# Patient Record
Sex: Female | Born: 1945 | Race: Black or African American | Hispanic: No | Marital: Single | Smoking: Never smoker
Health system: Southern US, Community
[De-identification: ages and names within clinical notes are randomized; demographics above are authoritative.]

## PROBLEM LIST (undated history)

## (undated) DIAGNOSIS — I10 Essential (primary) hypertension: Secondary | ICD-10-CM

## (undated) DIAGNOSIS — R011 Cardiac murmur, unspecified: Secondary | ICD-10-CM

## (undated) DIAGNOSIS — D649 Anemia, unspecified: Secondary | ICD-10-CM

## (undated) DIAGNOSIS — M1990 Unspecified osteoarthritis, unspecified site: Secondary | ICD-10-CM

## (undated) DIAGNOSIS — T783XXA Angioneurotic edema, initial encounter: Secondary | ICD-10-CM

## (undated) DIAGNOSIS — T7840XA Allergy, unspecified, initial encounter: Secondary | ICD-10-CM

## (undated) HISTORY — DX: Allergy, unspecified, initial encounter: T78.40XA

## (undated) HISTORY — PX: FOOT SURGERY: SHX648

---
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
## 1988-11-11 HISTORY — PX: MYOMECTOMY: SHX85

## 1989-11-11 HISTORY — PX: COLPOSCOPY: SHX161

## 1998-10-31 ENCOUNTER — Other Ambulatory Visit: Admission: RE | Admit: 1998-10-31 | Discharge: 1998-10-31 | Payer: Self-pay | Admitting: Obstetrics and Gynecology

## 1999-12-12 ENCOUNTER — Other Ambulatory Visit: Admission: RE | Admit: 1999-12-12 | Discharge: 1999-12-12 | Payer: Self-pay | Admitting: Obstetrics and Gynecology

## 1999-12-12 ENCOUNTER — Encounter: Payer: Self-pay | Admitting: Obstetrics and Gynecology

## 1999-12-12 ENCOUNTER — Encounter (INDEPENDENT_AMBULATORY_CARE_PROVIDER_SITE_OTHER): Payer: Self-pay

## 1999-12-12 ENCOUNTER — Encounter: Admission: RE | Admit: 1999-12-12 | Discharge: 1999-12-12 | Payer: Self-pay | Admitting: Obstetrics and Gynecology

## 2000-05-16 ENCOUNTER — Other Ambulatory Visit: Admission: RE | Admit: 2000-05-16 | Discharge: 2000-05-16 | Payer: Self-pay | Admitting: Obstetrics & Gynecology

## 2001-01-06 ENCOUNTER — Encounter: Payer: Self-pay | Admitting: Obstetrics and Gynecology

## 2001-01-06 ENCOUNTER — Encounter: Admission: RE | Admit: 2001-01-06 | Discharge: 2001-01-06 | Payer: Self-pay | Admitting: Obstetrics and Gynecology

## 2001-01-06 ENCOUNTER — Other Ambulatory Visit: Admission: RE | Admit: 2001-01-06 | Discharge: 2001-01-06 | Payer: Self-pay | Admitting: Obstetrics and Gynecology

## 2001-04-22 ENCOUNTER — Ambulatory Visit (HOSPITAL_COMMUNITY): Admission: RE | Admit: 2001-04-22 | Discharge: 2001-04-22 | Payer: Self-pay | Admitting: Gastroenterology

## 2001-05-04 ENCOUNTER — Encounter: Payer: Self-pay | Admitting: Gastroenterology

## 2001-05-04 ENCOUNTER — Ambulatory Visit (HOSPITAL_COMMUNITY): Admission: RE | Admit: 2001-05-04 | Discharge: 2001-05-04 | Payer: Self-pay | Admitting: Gastroenterology

## 2002-01-08 ENCOUNTER — Encounter: Payer: Self-pay | Admitting: Obstetrics & Gynecology

## 2002-01-08 ENCOUNTER — Other Ambulatory Visit: Admission: RE | Admit: 2002-01-08 | Discharge: 2002-01-08 | Payer: Self-pay | Admitting: Obstetrics & Gynecology

## 2002-01-08 ENCOUNTER — Encounter: Admission: RE | Admit: 2002-01-08 | Discharge: 2002-01-08 | Payer: Self-pay | Admitting: Obstetrics & Gynecology

## 2003-01-10 ENCOUNTER — Encounter: Payer: Self-pay | Admitting: Internal Medicine

## 2003-01-10 ENCOUNTER — Other Ambulatory Visit: Admission: RE | Admit: 2003-01-10 | Discharge: 2003-01-10 | Payer: Self-pay | Admitting: Obstetrics & Gynecology

## 2003-01-10 ENCOUNTER — Encounter: Admission: RE | Admit: 2003-01-10 | Discharge: 2003-01-10 | Payer: Self-pay | Admitting: Internal Medicine

## 2004-01-12 ENCOUNTER — Encounter: Admission: RE | Admit: 2004-01-12 | Discharge: 2004-01-12 | Payer: Self-pay | Admitting: Obstetrics & Gynecology

## 2004-01-13 ENCOUNTER — Other Ambulatory Visit: Admission: RE | Admit: 2004-01-13 | Discharge: 2004-01-13 | Payer: Self-pay | Admitting: Obstetrics & Gynecology

## 2005-02-19 ENCOUNTER — Encounter: Admission: RE | Admit: 2005-02-19 | Discharge: 2005-02-19 | Payer: Self-pay | Admitting: Obstetrics & Gynecology

## 2006-03-04 ENCOUNTER — Encounter: Admission: RE | Admit: 2006-03-04 | Discharge: 2006-03-04 | Payer: Self-pay | Admitting: Obstetrics & Gynecology

## 2007-03-13 ENCOUNTER — Encounter: Admission: RE | Admit: 2007-03-13 | Discharge: 2007-03-13 | Payer: Self-pay | Admitting: Internal Medicine

## 2008-03-21 ENCOUNTER — Encounter: Admission: RE | Admit: 2008-03-21 | Discharge: 2008-03-21 | Payer: Self-pay | Admitting: Internal Medicine

## 2009-03-22 ENCOUNTER — Encounter: Admission: RE | Admit: 2009-03-22 | Discharge: 2009-03-22 | Payer: Self-pay | Admitting: Obstetrics & Gynecology

## 2009-03-24 ENCOUNTER — Encounter: Admission: RE | Admit: 2009-03-24 | Discharge: 2009-03-24 | Payer: Self-pay | Admitting: Obstetrics & Gynecology

## 2010-03-23 ENCOUNTER — Encounter: Admission: RE | Admit: 2010-03-23 | Discharge: 2010-03-23 | Payer: Self-pay | Admitting: Obstetrics & Gynecology

## 2010-12-03 ENCOUNTER — Encounter: Payer: Self-pay | Admitting: Obstetrics & Gynecology

## 2011-02-20 ENCOUNTER — Other Ambulatory Visit: Payer: Self-pay | Admitting: Obstetrics & Gynecology

## 2011-02-20 DIAGNOSIS — Z1231 Encounter for screening mammogram for malignant neoplasm of breast: Secondary | ICD-10-CM

## 2011-03-26 ENCOUNTER — Ambulatory Visit
Admission: RE | Admit: 2011-03-26 | Discharge: 2011-03-26 | Disposition: A | Discharge status: Home / Self Care | Payer: BC Managed Care – PPO | Source: Ambulatory Visit | Attending: Obstetrics & Gynecology | Admitting: Obstetrics & Gynecology

## 2011-03-26 DIAGNOSIS — Z1231 Encounter for screening mammogram for malignant neoplasm of breast: Secondary | ICD-10-CM

## 2011-03-29 NOTE — Procedures (Signed)
[place]  Patient:    Patricia Soto, Patricia Soto                       MRN: [id number] Proc. Date: 04/22/01 Adm. Date:  [id number] Attending:  Charna Elizabeth CC:         Silverio Lay, M.D.  Velna Hatchet, M.D.   Procedure Report  DATE OF BIRTH:  [birth year].  PROCEDURE PERFORMED:   Esophagogastroduodenoscopy.  ENDOSCOPIST:  Anselmo Rod, M.D.  INSTRUMENT USED:  Olympus video panendoscope.  INDICATIONS FOR PROCEDURE:  Iron-deficiency anemia in a 65 year old African-American female, rule out peptic ulcer disease, esophagitis, gastritis, etc.  PREPROCEDURE PREPARATION:  Informed consent was procured from the patient. The patient was fasted for eight hours prior to the procedure.  PREPROCEDURE PHYSICAL:  VITAL SIGNS:  The patient had stable vital signs.  NECK:  Supple.  CHEST:  Clear to auscultation.  HEART:  S1 and S2, regular.  ABDOMEN:  Abdomen soft with normal bowel sounds.  DESCRIPTION OF PROCEDURE:  The patient was placed in the left lateral decubitus position and sedated with 50 mg of Demerol and 5 mg of Versed intravenously.  Once the patient was adequately sedated, maintained on low-flow oxygen, continuous cardiac monitoring, the Olympus video panendoscope was advanced through the mouth, over the tongue into the esophagus under direct vision.  The entire esophagus appeared normal without evidence of ring, stricture, masses, lesions, esophagitis, or varix mucosa.  The scope was then advanced into the stomach.  One small erosion was seen in the antrum.  There was no evidence of bleeding, ulcers, masses, or polyps.  The proximal small bowel also appeared normal.  The patient tolerated the procedure well without complications.  IMPRESSION:  Essentially normal esophagogastroduodenoscopy except for small antral erosion.  RECOMMENDATIONS:  Proceed with colonoscopy at this time. DD:  04/22/01 TD:  04/23/01 Job: [id number]

## 2011-03-29 NOTE — Procedures (Signed)
. [facility]  Patient:    Patricia Soto, Patricia Soto                       MRN: [id number] Proc. Date: 04/22/01 Adm. Date:  [id number] Attending:  Charna Elizabeth CC:         Silverio Lay, M.D.  Velna Hatchet, M.D.   Procedure Report  DATE OF BIRTH:  01-11-2046.  PROCEDURE PERFORMED:  Colonoscopy endoscopy.  INSTRUMENT USED:  Olympus video colonoscope.  INDICATIONS FOR PROCEDURE:  Iron-deficiency anemia in a 65 year old African-American female, rule out colonic polyps, masses, hemorrhoids, etc.  PREPROCEDURE PREPARATION:  Informed consent was procured from the patient. The patient was fasted for eight hours prior to procedure and prepped with a bottle of magnesium citrate and a gallon of NuLytely the night prior to the procedure.  PREPROCEDURE PHYSICAL:  VITAL SIGNS:  The patient had stable vital signs.  NECK:  Supple.  CHEST:  Clear to auscultation.  HEART:  S1 and S2, regular.  ABDOMEN:  Abdomen soft with normal bowel sounds.  No masses palpable.  DESCRIPTION OF PROCEDURE:  The patient was placed in the left lateral decubitus position and sedated with an additional 20 mg of Demerol.  Once the patient was adequately sedated, maintained on low flow oxygen, continuous cardiac monitoring, the Olympus video colonoscope was advanced in the rectum to the cecum without difficulty.  The entire colonic mucosa appeared healthy with normal vascular pattern, no erosions, no ulcerations, masses, polyps or diverticula were seen.  Small internal hemorrhoids were appreciated on retroflexion in the rectum.  The patient tolerated the procedure well without complications.  IMPRESSION:  Healthy-appearing colon except for small internal hemorrhoids. Procedure completed to the cecum.  RECOMMENDATIONS: 1. The patient is to follow up in the office on an outpatient basis.    Repeat CBC will be done. 2. Complete her GI evaluation, a small bowel follow-through will be    undertaken. DD:  04/22/01 TD:  04/23/01 Job: [id number]

## 2012-02-14 ENCOUNTER — Other Ambulatory Visit: Payer: Self-pay | Admitting: Obstetrics & Gynecology

## 2012-02-14 DIAGNOSIS — Z1231 Encounter for screening mammogram for malignant neoplasm of breast: Secondary | ICD-10-CM

## 2012-04-03 ENCOUNTER — Ambulatory Visit
Admission: RE | Admit: 2012-04-03 | Discharge: 2012-04-03 | Disposition: A | Discharge status: Home / Self Care | Payer: Medicare Other | Source: Ambulatory Visit | Attending: Obstetrics & Gynecology | Admitting: Obstetrics & Gynecology

## 2012-04-03 DIAGNOSIS — Z1231 Encounter for screening mammogram for malignant neoplasm of breast: Secondary | ICD-10-CM

## 2013-03-16 ENCOUNTER — Other Ambulatory Visit: Payer: Self-pay

## 2013-03-16 DIAGNOSIS — Z1231 Encounter for screening mammogram for malignant neoplasm of breast: Secondary | ICD-10-CM

## 2013-04-22 ENCOUNTER — Ambulatory Visit
Admission: RE | Admit: 2013-04-22 | Discharge: 2013-04-22 | Disposition: A | Discharge status: Home / Self Care | Payer: Medicare HMO | Source: Ambulatory Visit

## 2013-04-22 DIAGNOSIS — Z1231 Encounter for screening mammogram for malignant neoplasm of breast: Secondary | ICD-10-CM

## 2014-03-29 ENCOUNTER — Other Ambulatory Visit: Payer: Self-pay

## 2014-03-29 DIAGNOSIS — Z1231 Encounter for screening mammogram for malignant neoplasm of breast: Secondary | ICD-10-CM

## 2014-04-26 ENCOUNTER — Encounter (INDEPENDENT_AMBULATORY_CARE_PROVIDER_SITE_OTHER): Payer: Self-pay

## 2014-04-26 ENCOUNTER — Ambulatory Visit
Admission: RE | Admit: 2014-04-26 | Discharge: 2014-04-26 | Disposition: A | Discharge status: Home / Self Care | Payer: Medicare HMO | Source: Ambulatory Visit

## 2014-04-26 DIAGNOSIS — Z1231 Encounter for screening mammogram for malignant neoplasm of breast: Secondary | ICD-10-CM

## 2015-03-13 ENCOUNTER — Other Ambulatory Visit: Payer: Self-pay

## 2015-03-13 DIAGNOSIS — Z1231 Encounter for screening mammogram for malignant neoplasm of breast: Secondary | ICD-10-CM

## 2015-05-02 ENCOUNTER — Ambulatory Visit
Admission: RE | Admit: 2015-05-02 | Discharge: 2015-05-02 | Disposition: A | Discharge status: Home / Self Care | Payer: Medicare PPO | Source: Ambulatory Visit

## 2015-05-02 DIAGNOSIS — Z1231 Encounter for screening mammogram for malignant neoplasm of breast: Secondary | ICD-10-CM

## 2015-05-02 DIAGNOSIS — Z779 Other contact with and (suspected) exposures hazardous to health: Secondary | ICD-10-CM | POA: Diagnosis not present

## 2015-05-02 DIAGNOSIS — Z01419 Encounter for gynecological examination (general) (routine) without abnormal findings: Secondary | ICD-10-CM | POA: Diagnosis not present

## 2015-05-08 ENCOUNTER — Other Ambulatory Visit: Payer: Self-pay

## 2015-05-23 DIAGNOSIS — Z1389 Encounter for screening for other disorder: Secondary | ICD-10-CM | POA: Diagnosis not present

## 2015-05-23 DIAGNOSIS — Z0000 Encounter for general adult medical examination without abnormal findings: Secondary | ICD-10-CM | POA: Diagnosis not present

## 2015-05-23 DIAGNOSIS — R7309 Other abnormal glucose: Secondary | ICD-10-CM | POA: Diagnosis not present

## 2015-05-23 DIAGNOSIS — E559 Vitamin D deficiency, unspecified: Secondary | ICD-10-CM | POA: Diagnosis not present

## 2015-05-23 DIAGNOSIS — I129 Hypertensive chronic kidney disease with stage 1 through stage 4 chronic kidney disease, or unspecified chronic kidney disease: Secondary | ICD-10-CM | POA: Diagnosis not present

## 2015-05-23 DIAGNOSIS — N182 Chronic kidney disease, stage 2 (mild): Secondary | ICD-10-CM | POA: Diagnosis not present

## 2015-06-09 ENCOUNTER — Other Ambulatory Visit (HOSPITAL_COMMUNITY): Payer: Self-pay | Admitting: Internal Medicine

## 2015-06-09 DIAGNOSIS — E079 Disorder of thyroid, unspecified: Secondary | ICD-10-CM

## 2015-06-19 ENCOUNTER — Encounter (HOSPITAL_COMMUNITY): Payer: Medicare PPO

## 2015-06-20 ENCOUNTER — Encounter (HOSPITAL_COMMUNITY): Payer: Medicare PPO

## 2015-07-05 ENCOUNTER — Encounter (HOSPITAL_COMMUNITY): Payer: Medicare PPO

## 2015-07-05 ENCOUNTER — Ambulatory Visit (HOSPITAL_COMMUNITY)
Admission: RE | Admit: 2015-07-05 | Discharge: 2015-07-05 | Disposition: A | Discharge status: Home / Self Care | Payer: Medicare PPO | Source: Ambulatory Visit | Attending: Internal Medicine | Admitting: Internal Medicine

## 2015-07-05 DIAGNOSIS — E213 Hyperparathyroidism, unspecified: Secondary | ICD-10-CM | POA: Diagnosis not present

## 2015-07-05 DIAGNOSIS — E215 Disorder of parathyroid gland, unspecified: Secondary | ICD-10-CM | POA: Diagnosis not present

## 2015-07-05 DIAGNOSIS — E079 Disorder of thyroid, unspecified: Secondary | ICD-10-CM

## 2015-07-05 MED ORDER — TECHNETIUM TC 99M SESTAMIBI - CARDIOLITE
25.0000 | Freq: Once | INTRAVENOUS | Status: AC | PRN
  Administered 2015-07-05: 25 via INTRAVENOUS

## 2015-07-25 DIAGNOSIS — H40013 Open angle with borderline findings, low risk, bilateral: Secondary | ICD-10-CM | POA: Diagnosis not present

## 2015-07-25 DIAGNOSIS — H01003 Unspecified blepharitis right eye, unspecified eyelid: Secondary | ICD-10-CM | POA: Diagnosis not present

## 2015-07-25 DIAGNOSIS — H2513 Age-related nuclear cataract, bilateral: Secondary | ICD-10-CM | POA: Diagnosis not present

## 2015-07-25 DIAGNOSIS — H1851 Endothelial corneal dystrophy: Secondary | ICD-10-CM | POA: Diagnosis not present

## 2015-08-28 ENCOUNTER — Ambulatory Visit: Payer: Self-pay | Admitting: Surgery

## 2015-08-28 DIAGNOSIS — E210 Primary hyperparathyroidism: Secondary | ICD-10-CM | POA: Diagnosis not present

## 2015-09-20 DIAGNOSIS — H40013 Open angle with borderline findings, low risk, bilateral: Secondary | ICD-10-CM | POA: Diagnosis not present

## 2015-10-20 ENCOUNTER — Other Ambulatory Visit: Payer: Self-pay

## 2015-10-20 ENCOUNTER — Encounter (HOSPITAL_COMMUNITY)
Admission: RE | Admit: 2015-10-20 | Discharge: 2015-10-20 | Disposition: A | Discharge status: Home / Self Care | Payer: Medicare PPO | Source: Ambulatory Visit | Attending: Surgery | Admitting: Surgery

## 2015-10-20 ENCOUNTER — Encounter (HOSPITAL_COMMUNITY): Payer: Self-pay

## 2015-10-20 DIAGNOSIS — Z01818 Encounter for other preprocedural examination: Secondary | ICD-10-CM | POA: Insufficient documentation

## 2015-10-20 DIAGNOSIS — Z01812 Encounter for preprocedural laboratory examination: Secondary | ICD-10-CM | POA: Diagnosis not present

## 2015-10-20 DIAGNOSIS — E213 Hyperparathyroidism, unspecified: Secondary | ICD-10-CM | POA: Insufficient documentation

## 2015-10-20 HISTORY — DX: Essential (primary) hypertension: I10

## 2015-10-20 HISTORY — DX: Anemia, unspecified: D64.9

## 2015-10-20 HISTORY — DX: Unspecified osteoarthritis, unspecified site: M19.90

## 2015-10-20 HISTORY — DX: Cardiac murmur, unspecified: R01.1

## 2015-10-20 LAB — BASIC METABOLIC PANEL
Anion gap: 6 (ref 5–15)
BUN: 12 mg/dL (ref 6–20)
CALCIUM: 10.3 mg/dL (ref 8.9–10.3)
CO2: 27 mmol/L (ref 22–32)
CREATININE: 0.74 mg/dL (ref 0.44–1.00)
Chloride: 108 mmol/L (ref 101–111)
GFR calc non Af Amer: >60 mL/min (ref >60)
GLUCOSE: 94 mg/dL (ref 65–99)
Potassium: 3.4 mmol/L — ABNORMAL LOW (ref 3.5–5.1)
Sodium: 141 mmol/L (ref 135–145)

## 2015-10-20 LAB — CBC
HCT: 31.8 % — ABNORMAL LOW (ref 36.0–46.0)
Hemoglobin: 10.6 g/dL — ABNORMAL LOW (ref 12.0–15.0)
MCH: 32.3 pg (ref 26.0–34.0)
MCHC: 33.3 g/dL (ref 30.0–36.0)
MCV: 97 fL (ref 78.0–100.0)
PLATELETS: 154 10*3/uL (ref 150–400)
RBC: 3.28 MIL/uL — AB (ref 3.87–5.11)
RDW: 12.4 % (ref 11.5–15.5)
WBC: 3.7 10*3/uL — ABNORMAL LOW (ref 4.0–10.5)

## 2015-10-20 NOTE — Pre-Procedure Instructions (Addendum)
Patricia Soto  10/20/2015      [id number] [place] [id number] [place] [id number] Phone: [phone] Fax: [phone]    Your procedure is scheduled on 10/30/15  [facility] at 630 A.M.  Call this number if you have problems the morning of surgery:  [phone]   Remember:  Do not eat food or drink liquids after midnight.  Take these medicines the morning of surgery with A SIP OF WATER micardis   STOP all herbel meds, nsaids (aleve,naproxen,advil,ibuprofen) 5 days prior to surgery starting 10/25/15 including vit D, ginger, omega 3, tumeric, aspirin or any other vitamins   Do not wear jewelry, make-up or nail polish.  Do not wear lotions, powders, or perfumes.  You may wear deodorant.  Do not shave 48 hours prior to surgery.  Men may shave face and neck.  Do not bring valuables to the hospital.  [facility] is not responsible for any belongings or valuables.  Contacts, dentures or bridgework may not be worn into surgery.  Leave your suitcase in the car.  After surgery it may be brought to your room.  For patients admitted to the hospital, discharge time will be determined by your treatment team.  Patients discharged the day of surgery will not be allowed to drive home.   Name and phone number of your driver:    Special instructions:  Special Instructions: [name] - Preparing for Surgery  Before surgery, you can play an important role.  Because skin is not sterile, your skin needs to be as free of germs as possible.  You can reduce the number of germs on you skin by washing with CHG (chlorahexidine gluconate) soap before surgery.  CHG is an antiseptic cleaner which kills germs and bonds with the skin to continue killing germs even after washing.  Please DO NOT use if you have an allergy to CHG or antibacterial soaps.  If your skin becomes reddened/irritated stop using the CHG and inform your  nurse when you arrive at Short Stay.  Do not shave (including legs and underarms) for at least 48 hours prior to the first CHG shower.  You may shave your face.  Please follow these instructions carefully:   1.  Shower with CHG Soap the night before surgery and the morning of Surgery.  2.  If you choose to wash your hair, wash your hair first as usual with your normal shampoo.  3.  After you shampoo, rinse your hair and body thoroughly to remove the Shampoo.  4.  Use CHG as you would any other liquid soap.  You can apply chg directly  to the skin and wash gently with scrungie or a clean washcloth.  5.  Apply the CHG Soap to your body ONLY FROM THE NECK DOWN.  Do not use on open wounds or open sores.  Avoid contact with your eyes ears, mouth and genitals (private parts).  Wash genitals (private parts)       with your normal soap.  6.  Wash thoroughly, paying special attention to the area where your surgery will be performed.  7.  Thoroughly rinse your body with warm water from the neck down.  8.  DO NOT shower/wash with your normal soap after using and rinsing off the CHG Soap.  9.  Pat yourself dry with a clean towel.  10.  Wear clean pajamas.            11.  Place clean sheets on your bed the night of your first shower and do not sleep with pets.  Day of Surgery  Do not apply any lotions/deodorants the morning of surgery.  Please wear clean clothes to the hospital/surgery center.  Please read over the following fact sheets that you were given. Pain Booklet, Coughing and Deep Breathing and Surgical Site Infection Prevention

## 2015-10-25 ENCOUNTER — Encounter (HOSPITAL_COMMUNITY): Payer: Self-pay | Admitting: Surgery

## 2015-10-25 DIAGNOSIS — E210 Primary hyperparathyroidism: Secondary | ICD-10-CM | POA: Diagnosis present

## 2015-10-25 NOTE — H&P (Signed)
General Surgery [facility]  Patricia Soto DOB: 1946-04-06 Single / Language: [place] / Race: Black or African American Female  History of Present Illness  Patient referred by Dr. Dorothyann Pengobyn Sanders for evaluation of hypercalcemia and suspected primary hyperparathyroidism. Patient was noted on routine laboratory studies to have an elevated serum calcium level. Recent level measured 11.0. Patient returned to the office and underwent further testing including an intact PTH level which was elevated at 85 and a 25-hydroxy vitamin D level which was normal at 38.9. Patient then underwent a nuclear medicine parathyroid scan on July 05, 2015. This showed a focus of activity in the lower left position consistent with a large parathyroid adenoma or perhaps 2 adjacent adenomatous. Patient has had no prior head or neck surgery. She has no history of endocrine disease and is never been on medication. There is no family history of endocrine disease and specifically endocrine neoplasms. Patient denies history of nephrolithiasis. She has had a bone density scan and does not have osteoporosis. She denies fatigue.  Other Problems Heart murmur High blood pressure Hypercholesterolemia Thyroid Disease  Past Surgical History Foot Surgery Bilateral.  Diagnostic Studies History Colonoscopy 1-5 years ago Mammogram within last year Pap Smear 1-5 years ago  Allergies  No Known Drug Allergies10/17/2016  Medication History Losartan Potassium (100MG  Tablet, Oral) Active. Omega 3 (1000MG  Capsule, Oral) Active. Ginger (500MG  Capsule, Oral) Active. Turmeric Curcumin (500MG  Capsule, Oral) Active. Vitamin D (Cholecalciferol) (1000UNIT Tablet, Oral) Active. Medications Reconciled  Social History  Alcohol use Moderate alcohol use. No caffeine use No drug use Tobacco use Never smoker.  Family History  Arthritis Sister. Bleeding disorder Sister. Diabetes  Mellitus Father. Heart Disease Father. Hypertension Mother, Sister. Ischemic Bowel Disease Sister. Thyroid problems Sister.  Pregnancy / Birth History Age at menarche 9 years. Age of menopause 2056-60 Contraceptive History Oral contraceptives. Gravida 0 Para 0  Review of Systems General Not Present- Appetite Loss, Chills, Fatigue, Fever, Night Sweats, Weight Gain and Weight Loss. Skin Not Present- Change in Wart/Mole, Dryness, Hives, Jaundice, New Lesions, Non-Healing Wounds, Rash and Ulcer. HEENT Present- Wears glasses/contact lenses. Not Present- Earache, Hearing Loss, Hoarseness, Nose Bleed, Oral Ulcers, Ringing in the Ears, Seasonal Allergies, Sinus Pain, Sore Throat, Visual Disturbances and Yellow Eyes. Respiratory Not Present- Bloody sputum, Chronic Cough, Difficulty Breathing, Snoring and Wheezing. Cardiovascular Not Present- Chest Pain, Difficulty Breathing Lying Down, Leg Cramps, Palpitations, Rapid Heart Rate, Shortness of Breath and Swelling of Extremities. Gastrointestinal Not Present- Abdominal Pain, Bloating, Bloody Stool, Change in Bowel Habits, Chronic diarrhea, Constipation, Difficulty Swallowing, Excessive gas, Gets full quickly at meals, Hemorrhoids, Indigestion, Nausea, Rectal Pain and Vomiting. Female Genitourinary Not Present- Frequency, Nocturia, Painful Urination, Pelvic Pain and Urgency. Musculoskeletal Not Present- Back Pain, Joint Pain, Joint Stiffness, Muscle Pain, Muscle Weakness and Swelling of Extremities. Neurological Not Present- Decreased Memory, Fainting, Headaches, Numbness, Seizures, Tingling, Tremor, Trouble walking and Weakness. Psychiatric Not Present- Anxiety, Bipolar, Change in Sleep Pattern, Depression, Fearful and Frequent crying. Endocrine Not Present- Cold Intolerance, Excessive Hunger, Hair Changes, Heat Intolerance, Hot flashes and New Diabetes. Hematology Not Present- Easy Bruising, Excessive bleeding, Gland problems, HIV and  Persistent Infections.  Vitals Weight: 137 lb Height: 60in Body Surface Area: 1.59 m Body Mass Index: 26.76 kg/m  Temp.: 97.73F(Temporal)  Pulse: 65 (Regular)  BP: 126/72 (Sitting, Left Arm, Standard)  Physical Exam   General - appears comfortable, no distress; not diaphorectic  HEENT - normocephalic; sclerae clear, gaze conjugate; mucous membranes moist, dentition good; voice  normal  Neck - symmetric on extension; no palpable anterior or posterior cervical adenopathy; no palpable masses in the thyroid bed  Chest - clear bilaterally without rhonchi, rales, or wheeze  Cor - regular rhythm with normal rate; no significant murmur  Ext - non-tender without significant edema or lymphedema  Neuro - grossly intact; no tremor  Assessment & Plan   PRIMARY HYPERPARATHYROIDISM (E21.0) HYPERCALCEMIA (E83.52)  Patient presents with hypercalcemia and evidence of primary hyperparathyroidism. I have provided her with written literature on parathyroid surgery to review at home.  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 I believe the patient is a good candidate for minimally invasive surgery. We discussed risk and benefits of the procedure. I believe this can be done as an outpatient procedure. We discussed the possibility of a second gland adenoma. Patient understands and wishes to proceed with surgery in the near future. We discussed restrictions on her activities following the procedure and the possibility of an overnight stay following surgery.  The risks and benefits of the procedure have been discussed at length with the patient. The patient understands the proposed procedure, potential alternative treatments, and the course of recovery to be expected. All of the patient's questions have been answered at this time. The patient wishes to proceed with surgery.  Velora Heckler, MD, FACS General & Endocrine Surgery [facility] Office:  [phone]

## 2015-10-29 MED ORDER — CEFAZOLIN SODIUM-DEXTROSE 2-3 GM-% IV SOLR
2.0000 g | INTRAVENOUS | Status: AC
  Administered 2015-10-30: 2 g via INTRAVENOUS
  Filled 2015-10-29: qty 50

## 2015-10-30 ENCOUNTER — Encounter (HOSPITAL_COMMUNITY): Payer: Self-pay | Admitting: *Deleted

## 2015-10-30 ENCOUNTER — Ambulatory Visit (HOSPITAL_COMMUNITY): Payer: Medicare PPO | Admitting: Anesthesiology

## 2015-10-30 ENCOUNTER — Encounter (HOSPITAL_COMMUNITY)
Admission: RE | Disposition: A | Discharge status: Home / Self Care | Payer: Self-pay | Source: Ambulatory Visit | Attending: Surgery

## 2015-10-30 ENCOUNTER — Ambulatory Visit (HOSPITAL_COMMUNITY)
Admission: RE | Admit: 2015-10-30 | Discharge: 2015-10-30 | Disposition: A | Discharge status: Home / Self Care | Payer: Medicare PPO | Source: Ambulatory Visit | Attending: Surgery | Admitting: Surgery

## 2015-10-30 DIAGNOSIS — Z79899 Other long term (current) drug therapy: Secondary | ICD-10-CM | POA: Diagnosis not present

## 2015-10-30 DIAGNOSIS — E210 Primary hyperparathyroidism: Secondary | ICD-10-CM | POA: Diagnosis not present

## 2015-10-30 DIAGNOSIS — E7800 Pure hypercholesterolemia, unspecified: Secondary | ICD-10-CM | POA: Diagnosis not present

## 2015-10-30 DIAGNOSIS — E213 Hyperparathyroidism, unspecified: Secondary | ICD-10-CM | POA: Diagnosis not present

## 2015-10-30 HISTORY — PX: PARATHYROIDECTOMY: SHX19

## 2015-10-30 SURGERY — PARATHYROIDECTOMY
Anesthesia: General | Site: Neck

## 2015-10-30 MED ORDER — GLYCOPYRROLATE 0.2 MG/ML IJ SOLN
INTRAMUSCULAR | Status: DC | PRN
  Administered 2015-10-30: 0.4 mg via INTRAVENOUS

## 2015-10-30 MED ORDER — PHENYLEPHRINE HCL 10 MG/ML IJ SOLN
INTRAMUSCULAR | Status: DC | PRN
  Administered 2015-10-30 (×3): 80 ug via INTRAVENOUS

## 2015-10-30 MED ORDER — LIDOCAINE HCL (CARDIAC) 20 MG/ML IV SOLN
INTRAVENOUS | Status: DC | PRN
  Administered 2015-10-30: 30 mg via INTRAVENOUS
  Administered 2015-10-30: 70 mg via INTRAVENOUS

## 2015-10-30 MED ORDER — HYDROCODONE-ACETAMINOPHEN 5-325 MG PO TABS: 1.0000 | ORAL_TABLET | ORAL | Status: DC | PRN

## 2015-10-30 MED ORDER — LACTATED RINGERS IV SOLN
INTRAVENOUS | Status: DC
  Administered 2015-10-30: 07:00:00 via INTRAVENOUS

## 2015-10-30 MED ORDER — LIDOCAINE HCL (CARDIAC) 20 MG/ML IV SOLN
INTRAVENOUS | Status: AC
  Filled 2015-10-30: qty 5

## 2015-10-30 MED ORDER — DEXAMETHASONE SODIUM PHOSPHATE 4 MG/ML IJ SOLN
INTRAMUSCULAR | Status: AC
  Filled 2015-10-30: qty 1

## 2015-10-30 MED ORDER — ONDANSETRON HCL 4 MG/2ML IJ SOLN
INTRAMUSCULAR | Status: AC
  Filled 2015-10-30: qty 2

## 2015-10-30 MED ORDER — GLYCOPYRROLATE 0.2 MG/ML IJ SOLN
INTRAMUSCULAR | Status: AC
  Filled 2015-10-30: qty 2

## 2015-10-30 MED ORDER — ROCURONIUM BROMIDE 100 MG/10ML IV SOLN
INTRAVENOUS | Status: DC | PRN
  Administered 2015-10-30: 40 mg via INTRAVENOUS

## 2015-10-30 MED ORDER — PROPOFOL 10 MG/ML IV BOLUS
INTRAVENOUS | Status: AC
  Filled 2015-10-30: qty 20

## 2015-10-30 MED ORDER — BUPIVACAINE HCL (PF) 0.25 % IJ SOLN
INTRAMUSCULAR | Status: AC
  Filled 2015-10-30: qty 30

## 2015-10-30 MED ORDER — PROPOFOL 10 MG/ML IV BOLUS
INTRAVENOUS | Status: DC | PRN
  Administered 2015-10-30: 130 mg via INTRAVENOUS
  Administered 2015-10-30: 30 mg via INTRAVENOUS

## 2015-10-30 MED ORDER — SODIUM CHLORIDE 0.9 % IV SOLN
10.0000 mg | INTRAVENOUS | Status: DC | PRN
  Administered 2015-10-30: 10 ug/min via INTRAVENOUS

## 2015-10-30 MED ORDER — ONDANSETRON HCL 4 MG/2ML IJ SOLN
INTRAMUSCULAR | Status: DC | PRN
  Administered 2015-10-30: 4 mg via INTRAVENOUS

## 2015-10-30 MED ORDER — BUPIVACAINE HCL (PF) 0.25 % IJ SOLN
INTRAMUSCULAR | Status: DC | PRN
  Administered 2015-10-30: 30 mL

## 2015-10-30 MED ORDER — MIDAZOLAM HCL 2 MG/2ML IJ SOLN
INTRAMUSCULAR | Status: AC
  Filled 2015-10-30: qty 2

## 2015-10-30 MED ORDER — MIDAZOLAM HCL 5 MG/5ML IJ SOLN
INTRAMUSCULAR | Status: DC | PRN
  Administered 2015-10-30: 2 mg via INTRAVENOUS

## 2015-10-30 MED ORDER — DEXAMETHASONE SODIUM PHOSPHATE 4 MG/ML IJ SOLN
INTRAMUSCULAR | Status: DC | PRN
  Administered 2015-10-30: 4 mg via INTRAVENOUS

## 2015-10-30 MED ORDER — 0.9 % SODIUM CHLORIDE (POUR BTL) OPTIME
TOPICAL | Status: DC | PRN
  Administered 2015-10-30: 1000 mL

## 2015-10-30 MED ORDER — ROCURONIUM BROMIDE 50 MG/5ML IV SOLN
INTRAVENOUS | Status: AC
  Filled 2015-10-30: qty 1

## 2015-10-30 MED ORDER — FENTANYL CITRATE (PF) 100 MCG/2ML IJ SOLN
INTRAMUSCULAR | Status: DC | PRN
  Administered 2015-10-30: 100 ug via INTRAVENOUS
  Administered 2015-10-30 (×2): 50 ug via INTRAVENOUS

## 2015-10-30 MED ORDER — PHENYLEPHRINE 40 MCG/ML (10ML) SYRINGE FOR IV PUSH (FOR BLOOD PRESSURE SUPPORT)
PREFILLED_SYRINGE | INTRAVENOUS | Status: AC
  Filled 2015-10-30: qty 10

## 2015-10-30 MED ORDER — NEOSTIGMINE METHYLSULFATE 10 MG/10ML IV SOLN
INTRAVENOUS | Status: DC | PRN
  Administered 2015-10-30: 3 mg via INTRAVENOUS

## 2015-10-30 MED ORDER — FENTANYL CITRATE (PF) 250 MCG/5ML IJ SOLN
INTRAMUSCULAR | Status: AC
  Filled 2015-10-30: qty 5

## 2015-10-30 MED ORDER — HYDROMORPHONE HCL 1 MG/ML IJ SOLN: 0.2500 mg | INTRAMUSCULAR | Status: DC | PRN

## 2015-10-30 SURGICAL SUPPLY — 56 items
APL SKNCLS STERI-STRIP NONHPOA (GAUZE/BANDAGES/DRESSINGS) ×1
ATTRACTOMAT 16X20 MAGNETIC DRP (DRAPES) ×3 IMPLANT
BENZOIN TINCTURE PRP APPL 2/3 (GAUZE/BANDAGES/DRESSINGS) ×3 IMPLANT
BLADE SURG 10 STRL SS (BLADE) ×3 IMPLANT
BLADE SURG 15 STRL LF DISP TIS (BLADE) ×1 IMPLANT
BLADE SURG 15 STRL SS (BLADE) ×3
CANISTER SUCTION 2500CC (MISCELLANEOUS) ×3 IMPLANT
CHLORAPREP W/TINT 26ML (MISCELLANEOUS) ×3 IMPLANT
CLIP TI MEDIUM 6 (CLIP) ×3 IMPLANT
CLIP TI WIDE RED SMALL 6 (CLIP) ×9 IMPLANT
CLOSURE WOUND 1/2 X4 (GAUZE/BANDAGES/DRESSINGS) ×1
CONT SPEC 4OZ CLIKSEAL STRL BL (MISCELLANEOUS) ×5 IMPLANT
COVER SURGICAL LIGHT HANDLE (MISCELLANEOUS) ×3 IMPLANT
CRADLE DONUT ADULT HEAD (MISCELLANEOUS) ×3 IMPLANT
DRAPE PED LAPAROTOMY (DRAPES) ×3 IMPLANT
DRAPE UTILITY XL STRL (DRAPES) ×3 IMPLANT
ELECT CAUTERY BLADE 6.4 (BLADE) ×3 IMPLANT
ELECT REM PT RETURN 9FT ADLT (ELECTROSURGICAL) ×3
ELECTRODE REM PT RTRN 9FT ADLT (ELECTROSURGICAL) ×1 IMPLANT
GAUZE SPONGE 2X2 8PLY STRL LF (GAUZE/BANDAGES/DRESSINGS) ×1 IMPLANT
GAUZE SPONGE 4X4 16PLY XRAY LF (GAUZE/BANDAGES/DRESSINGS) ×3 IMPLANT
GLOVE BIO SURGEON STRL SZ7.5 (GLOVE) ×2 IMPLANT
GLOVE BIOGEL PI IND STRL 7.0 (GLOVE) IMPLANT
GLOVE BIOGEL PI IND STRL 7.5 (GLOVE) ×2 IMPLANT
GLOVE BIOGEL PI INDICATOR 7.0 (GLOVE) ×2
GLOVE BIOGEL PI INDICATOR 7.5 (GLOVE) ×4
GLOVE SURG ORTHO 8.0 STRL STRW (GLOVE) ×3 IMPLANT
GLOVE SURG SS PI 7.0 STRL IVOR (GLOVE) ×2 IMPLANT
GOWN STRL REUS W/ TWL LRG LVL3 (GOWN DISPOSABLE) ×1 IMPLANT
GOWN STRL REUS W/ TWL XL LVL3 (GOWN DISPOSABLE) ×1 IMPLANT
GOWN STRL REUS W/TWL LRG LVL3 (GOWN DISPOSABLE) ×3
GOWN STRL REUS W/TWL XL LVL3 (GOWN DISPOSABLE) ×3
HEMOSTAT SURGICEL 2X4 FIBR (HEMOSTASIS) ×3 IMPLANT
KIT BASIN OR (CUSTOM PROCEDURE TRAY) ×3 IMPLANT
KIT ROOM TURNOVER OR (KITS) ×3 IMPLANT
NDL HYPO 25GX1X1/2 BEV (NEEDLE) ×1 IMPLANT
NEEDLE HYPO 25GX1X1/2 BEV (NEEDLE) ×3 IMPLANT
NS IRRIG 1000ML POUR BTL (IV SOLUTION) ×3 IMPLANT
PACK SURGICAL SETUP 50X90 (CUSTOM PROCEDURE TRAY) ×3 IMPLANT
PAD ARMBOARD 7.5X6 YLW CONV (MISCELLANEOUS) ×3 IMPLANT
PENCIL BUTTON HOLSTER BLD 10FT (ELECTRODE) ×3 IMPLANT
SPONGE GAUZE 2X2 STER 10/PKG (GAUZE/BANDAGES/DRESSINGS) ×2
SPONGE INTESTINAL PEANUT (DISPOSABLE) IMPLANT
STRIP CLOSURE SKIN 1/2X4 (GAUZE/BANDAGES/DRESSINGS) ×2 IMPLANT
SUT MNCRL AB 4-0 PS2 18 (SUTURE) ×3 IMPLANT
SUT SILK 2 0 (SUTURE)
SUT SILK 2-0 18XBRD TIE 12 (SUTURE) IMPLANT
SUT SILK 3 0 (SUTURE)
SUT SILK 3-0 18XBRD TIE 12 (SUTURE) IMPLANT
SUT VIC AB 3-0 SH 18 (SUTURE) ×3 IMPLANT
SYR BULB 3OZ (MISCELLANEOUS) ×3 IMPLANT
SYR CONTROL 10ML LL (SYRINGE) ×2 IMPLANT
TOWEL OR 17X24 6PK STRL BLUE (TOWEL DISPOSABLE) ×3 IMPLANT
TOWEL OR 17X26 10 PK STRL BLUE (TOWEL DISPOSABLE) ×3 IMPLANT
TUBE CONNECTING 12'X1/4 (SUCTIONS) ×1
TUBE CONNECTING 12X1/4 (SUCTIONS) ×2 IMPLANT

## 2015-10-30 NOTE — Transfer of Care (Signed)
Immediate Anesthesia Transfer of Care Note  Patient: Patricia Soto  Procedure(s) Performed: Procedure(s): PARATHYROIDECTOMY (N/A)  Patient Location: PACU  Anesthesia Type:General  Level of Consciousness: awake, alert  and oriented  Airway & Oxygen Therapy: Patient Spontanous Breathing and Patient connected to nasal cannula oxygen  Post-op Assessment: Report given to RN, Post -op Vital signs reviewed and stable and Patient moving all extremities  Post vital signs: Reviewed and stable  Last Vitals:  Filed Vitals:   10/30/15 0641 10/30/15 1005  BP: 158/74   Pulse: 78 82  Temp: 36.3 C 36.5 C  Resp: 20 13    Complications: No apparent anesthesia complications

## 2015-10-30 NOTE — Anesthesia Procedure Notes (Signed)
Procedure Name: Intubation Date/Time: 10/30/2015 8:43 AM Performed by: Kyung Rudd Pre-anesthesia Checklist: Patient identified, Emergency Drugs available, Suction available, Patient being monitored and Timeout performed Patient Re-evaluated:Patient Re-evaluated prior to inductionOxygen Delivery Method: Circle system utilized Preoxygenation: Pre-oxygenation with 100% oxygen Intubation Type: IV induction Ventilation: Mask ventilation without difficulty Laryngoscope Size: Mac and 3 Grade View: Grade I Tube type: Oral Tube size: 7.0 mm Number of attempts: 1 Airway Equipment and Method: Stylet and LTA kit utilized Placement Confirmation: ETT inserted through vocal cords under direct vision,  positive ETCO2 and breath sounds checked- equal and bilateral Secured at: 21 cm Tube secured with: Tape Dental Injury: Teeth and Oropharynx as per pre-operative assessment

## 2015-10-30 NOTE — Anesthesia Postprocedure Evaluation (Signed)
Anesthesia Post Note  Patient: Patricia Soto  Procedure(s) Performed: Procedure(s) (LRB): PARATHYROIDECTOMY (N/A)  Patient location during evaluation: PACU Anesthesia Type: General Level of consciousness: awake Pain management: pain level controlled Vital Signs Assessment: post-procedure vital signs reviewed and stable Respiratory status: spontaneous breathing Cardiovascular status: stable Anesthetic complications: no    Last Vitals:  Filed Vitals:   10/30/15 1020 10/30/15 1035  BP: 136/65   Pulse: 73 69  Temp:    Resp: 18 14    Last Pain:  Filed Vitals:   10/30/15 1052  PainSc: 2                  EDWARDS,Antonyo Hinderer

## 2015-10-30 NOTE — Interval H&P Note (Signed)
History and Physical Interval Note:  10/30/2015 8:27 AM  Patricia Soto  has presented today for surgery, with the diagnosis of Hyperparathyroidism.  The various methods of treatment have been discussed with the patient and family. After consideration of risks, benefits and other options for treatment, the patient has consented to    Procedure(s): PARATHYROIDECTOMY (N/A) as a surgical intervention .    The patient's history has been reviewed, patient examined, no change in status, stable for surgery.  I have reviewed the patient's chart and labs.  Questions were answered to the patient's satisfaction.    Velora Hecklerodd M. Anamaria Dusenbury, MD, FACS General & Endocrine Surgery [facility] Office: [phone]   Natalyah Cummiskey MJudie Petit

## 2015-10-30 NOTE — Anesthesia Preprocedure Evaluation (Addendum)
Anesthesia Evaluation  Patient identified by MRN, date of birth, ID band Patient awake    Reviewed: Allergy & Precautions, NPO status , Patient's Chart, lab work & pertinent test results  Airway Mallampati: II  TM Distance: >3 FB Neck ROM: Full    Dental  (+) Teeth Intact   Pulmonary neg pulmonary ROS,    breath sounds clear to auscultation       Cardiovascular hypertension,  Rhythm:Regular     Neuro/Psych    GI/Hepatic negative GI ROS, Neg liver ROS,   Endo/Other    Renal/GU negative Renal ROS     Musculoskeletal   Abdominal   Peds  Hematology   Anesthesia Other Findings   Reproductive/Obstetrics                            Anesthesia Physical Anesthesia Plan  ASA: III  Anesthesia Plan: General   Post-op Pain Management:    Induction: Intravenous  Airway Management Planned: Oral ETT  Additional Equipment:   Intra-op Plan:   Post-operative Plan: Extubation in OR  Informed Consent: I have reviewed the patients History and Physical, chart, labs and discussed the procedure including the risks, benefits and alternatives for the proposed anesthesia with the patient or authorized representative who has indicated his/her understanding and acceptance.   Dental advisory given  Plan Discussed with: CRNA and Anesthesiologist  Anesthesia Plan Comments:         Anesthesia Quick Evaluation

## 2015-10-30 NOTE — Op Note (Signed)
OPERATIVE REPORT - PARATHYROIDECTOMY  Preoperative diagnosis: Primary hyperparathyroidism  Postop diagnosis: Same  Procedure: Left superior minimally invasive parathyroidectomy  Surgeon:  Velora Hecklerodd M. Ameisha Mcclellan, MD, FACS  Anesthesia: Gen. endotracheal  Estimated blood loss: Minimal  Preparation: ChloraPrep  Indications: Patient referred by Dr. Dorothyann Pengobyn Sanders for evaluation of hypercalcemia and suspected primary hyperparathyroidism. Patient was noted on routine laboratory studies to have an elevated serum calcium level. Recent level measured 11.0. Patient returned to the office and underwent further testing including an intact PTH level which was elevated at 85 and a 25-hydroxy vitamin D level which was normal at 38.9. Patient then underwent a nuclear medicine parathyroid scan on July 05, 2015. This showed a focus of activity in the lower left position consistent with a large parathyroid adenoma or perhaps 2 adjacent adenomatous.  Procedure: Patient was prepared in the holding area. He was brought to operating room and placed in a supine position on the operating room table. Following administration of general anesthesia, the patient was positioned and then prepped and draped in the usual strict aseptic fashion. After ascertaining that an adequate level of anesthesia been achieved, a neck incision was made with a #15 blade. Dissection was carried through subcutaneous tissues and platysma. Hemostasis was obtained with the electrocautery. Skin flaps were developed circumferentially and a Weitlander retractor was placed for exposure.  Strap muscles were incised in the midline. Strap muscles were reflected exposing the thyroid lobe. With gentle blunt dissection the thyroid lobe was mobilized.  Dissection was carried through adipose tissue and an enlarged parathyroid gland was identified. It was gently mobilized. Vascular structures were divided between small and medium ligaclips. Care was taken to avoid  the recurrent laryngeal nerve and the esophagus. The parathyroid gland was completely excised. It was submitted to pathology where frozen section confirmed parathyroid tissue consistent with adenoma.  There was a nodule on the inferior pole of the left lobe of the thyroid gland at the origin of the thyrothymic tract.  This was also excised and submitted for frozen section.  This tissue was thyroid in origin.  Neck was irrigated with warm saline and good hemostasis was noted. Fibrillar was placed in the operative field. Strap muscles were reapproximated in the midline with interrupted 3-0 Vicryl sutures. Platysma was closed with interrupted 3-0 Vicryl sutures. Skin was closed with a running 4-0 Monocryl subcuticular suture. Marcaine was infiltrated circumferentially. Wound was washed and dried and benzoin and Steri-Strips were applied. Sterile gauze dressings were applied. Patient was awakened from anesthesia and brought to the recovery room. The patient tolerated the procedure well.   Velora Hecklerodd M. Shynia Daleo, MD, FACS General & Endocrine Surgery [facility]

## 2015-10-31 ENCOUNTER — Encounter (HOSPITAL_COMMUNITY): Payer: Self-pay | Admitting: Surgery

## 2015-11-07 DIAGNOSIS — E210 Primary hyperparathyroidism: Secondary | ICD-10-CM | POA: Diagnosis not present

## 2016-03-29 ENCOUNTER — Other Ambulatory Visit: Payer: Self-pay

## 2016-03-29 DIAGNOSIS — Z1231 Encounter for screening mammogram for malignant neoplasm of breast: Secondary | ICD-10-CM

## 2016-05-09 ENCOUNTER — Other Ambulatory Visit: Payer: Self-pay | Admitting: Internal Medicine

## 2016-05-09 ENCOUNTER — Ambulatory Visit
Admission: RE | Admit: 2016-05-09 | Discharge: 2016-05-09 | Disposition: A | Discharge status: Home / Self Care | Payer: Medicare Other | Source: Ambulatory Visit

## 2016-05-09 DIAGNOSIS — Z1231 Encounter for screening mammogram for malignant neoplasm of breast: Secondary | ICD-10-CM

## 2017-03-26 ENCOUNTER — Other Ambulatory Visit: Payer: Self-pay | Admitting: Internal Medicine

## 2017-03-26 DIAGNOSIS — Z1231 Encounter for screening mammogram for malignant neoplasm of breast: Secondary | ICD-10-CM

## 2017-05-20 ENCOUNTER — Encounter: Payer: Self-pay | Admitting: Obstetrics & Gynecology

## 2017-05-20 ENCOUNTER — Ambulatory Visit (INDEPENDENT_AMBULATORY_CARE_PROVIDER_SITE_OTHER): Payer: Medicare Other | Admitting: Obstetrics & Gynecology

## 2017-05-20 ENCOUNTER — Ambulatory Visit
Admission: RE | Admit: 2017-05-20 | Discharge: 2017-05-20 | Disposition: A | Discharge status: Home / Self Care | Payer: Medicare Other | Source: Ambulatory Visit | Attending: Internal Medicine | Admitting: Internal Medicine

## 2017-05-20 VITALS — BP 154/86 | Ht 60.0 in | Wt 140.0 lb

## 2017-05-20 DIAGNOSIS — Z780 Asymptomatic menopausal state: Secondary | ICD-10-CM | POA: Diagnosis not present

## 2017-05-20 DIAGNOSIS — Z01411 Encounter for gynecological examination (general) (routine) with abnormal findings: Secondary | ICD-10-CM

## 2017-05-20 DIAGNOSIS — N393 Stress incontinence (female) (male): Secondary | ICD-10-CM

## 2017-05-20 DIAGNOSIS — Z1231 Encounter for screening mammogram for malignant neoplasm of breast: Secondary | ICD-10-CM

## 2017-05-20 NOTE — Patient Instructions (Signed)
1. Encounter for gynecological examination with abnormal finding Normal gyn exam except Atrophic Vaginitis.  Pap normal 04/2016.  Will repeat next year.  Breasts wnl.  [facility]  2. Menopause present No HRT.  No PMB.  ASxic.  Vit D supplement.  Ca++ in food.  Weight bearing physical activity. - DG BONE DENSITY (DXA); Future  3. 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  Continue Kegels.  Patricia Soto, it was a pleasure to see you today!

## 2017-05-20 NOTE — Progress Notes (Signed)
Patricia Soto 02-17-1946 [id number]   History:    71 y.o. G0 Single.  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  Went to [place] recently.  RP:  Established patient for annual gyn exam   HPI:  Menopause.  No HRT.  No PMB.  No pelvic pain.  Abstinent.  Very active, does the Silver snicker program at the [facility] with her sister.  Breasts wnl.  Very mild SUI, does Kegels.  Past medical history,surgical history, family history and social history were all reviewed and documented in the EPIC chart.  Gynecologic History No LMP recorded. Patient is postmenopausal. Contraception: abstinence and post menopausal status Last Pap: 04/2016. Results were: normal Last mammogram: 05/20/2017. Results were: Pending Bone Density 09/2014  Obstetric History OB History  Gravida Para Term Preterm AB Living  0 0 0 0 0 0  SAB TAB Ectopic Multiple Live Births  0 0 0 0 0         ROS: A ROS was performed and pertinent positives and negatives are included in the history.  GENERAL: No fevers or chills. HEENT: No change in vision, no earache, sore throat or sinus congestion. NECK: No pain or stiffness. CARDIOVASCULAR: No chest pain or pressure. No palpitations. PULMONARY: No shortness of breath, cough or wheeze. GASTROINTESTINAL: No abdominal pain, nausea, vomiting or diarrhea, melena or bright red blood per rectum. GENITOURINARY: No urinary frequency, urgency, hesitancy or dysuria. MUSCULOSKELETAL: No joint or muscle pain, no back pain, no recent trauma. DERMATOLOGIC: No rash, no itching, no lesions. ENDOCRINE: No polyuria, polydipsia, no heat or cold intolerance. No recent change in weight. HEMATOLOGICAL: No anemia or easy bruising or bleeding. NEUROLOGIC: No headache, seizures, numbness, tingling or weakness. PSYCHIATRIC: No depression, no loss of interest in normal activity or change in sleep pattern.     Exam:   BP (!) 154/86   Ht 5' (1.524 m)   Wt 63.5 kg (140 lb)   BMI 27.34 kg/m   Body  mass index is 27.34 kg/m.  General appearance : Well developed well nourished female. No acute distress HEENT: Eyes: no retinal hemorrhage or exudates,  Neck supple, trachea midline, no carotid bruits, no thyroidmegaly Lungs: Clear to auscultation, no rhonchi or wheezes, or rib retractions  Heart: Regular rate and rhythm, no murmurs or gallops Breast:Examined in sitting and supine position were symmetrical in appearance, no palpable masses or tenderness,  no skin retraction, no nipple inversion, no nipple discharge, no skin discoloration, no axillary or supraclavicular lymphadenopathy Abdomen: no palpable masses or tenderness, no rebound or guarding Extremities: no edema or skin discoloration or tenderness  Pelvic:  Bartholin, Urethra, Skene Glands: Within normal limits             Vagina: No gross lesions or discharge  Cervix: No gross lesions or discharge  Uterus  AV, normal size, shape and consistency, non-tender and mobile  Adnexa  Without masses or tenderness  Anus and perineum  normal     Assessment/Plan:  71 y.o. female for annual exam   1. Encounter for gynecological examination with abnormal finding Normal gyn exam except Atrophic Vaginitis.  Pap normal 04/2016.  Will repeat next year.  Breasts wnl.  [facility]  2. Menopause present No HRT.  No PMB.  ASxic.  Vit D supplement.  Ca++ in food.  Weight bearing physical activity. - DG BONE DENSITY (DXA); Future  3. 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  Continue Kegels.  Counseling on above issues >50% x 10 minutes.  Genia DelMarie-Lyne Fusaye Wachtel MD, 11:20 AM 05/20/2017

## 2017-08-01 ENCOUNTER — Encounter: Payer: Self-pay | Admitting: Podiatry

## 2017-08-01 ENCOUNTER — Ambulatory Visit (INDEPENDENT_AMBULATORY_CARE_PROVIDER_SITE_OTHER): Payer: Medicare Other

## 2017-08-01 ENCOUNTER — Ambulatory Visit (INDEPENDENT_AMBULATORY_CARE_PROVIDER_SITE_OTHER): Payer: Medicare Other | Admitting: Podiatry

## 2017-08-01 ENCOUNTER — Ambulatory Visit: Payer: Medicare Other

## 2017-08-01 VITALS — BP 167/93 | HR 80

## 2017-08-01 DIAGNOSIS — M21619 Bunion of unspecified foot: Secondary | ICD-10-CM

## 2017-08-01 DIAGNOSIS — Z472 Encounter for removal of internal fixation device: Secondary | ICD-10-CM | POA: Diagnosis not present

## 2017-08-01 NOTE — Progress Notes (Signed)
   Subjective:    Patient ID: Patricia Soto, female    DOB: 1946/03/01, 71 y.o.   MRN: [id number]  HPI  Chief Complaint  Patient presents with  . Foot Pain    Rt foot       Review of Systems  All other systems reviewed and are negative.      Objective:   Physical Exam        Assessment & Plan:

## 2017-08-01 NOTE — Patient Instructions (Signed)

## 2017-08-04 ENCOUNTER — Telehealth: Payer: Self-pay | Admitting: *Deleted

## 2017-08-04 NOTE — Telephone Encounter (Signed)
"  I need to reschedule my surgery from October 1 to October 8, if possible."  Yes, that date is available.  "Will it be the same time?"  Yes, arrival time is the same.

## 2017-08-04 NOTE — Progress Notes (Signed)
Subjective:    Patient ID: Patricia Soto, female   DOB: 71 y.o.   MRN: [id number]   HPI patient presents stating she has a very painful spot on top of her right foot that's been there for several months and has history of bunion correction done a number of years ago. She does not smoke and does like to wear shoes that put pressure against the area    Review of Systems  All other systems reviewed and are negative.       Objective:  Physical Exam  Constitutional: She appears well-developed and well-nourished.  Cardiovascular: Intact distal pulses.   Pulmonary/Chest: Effort normal.  Musculoskeletal: Normal range of motion.  Neurological: She is alert.  Skin: Skin is warm.  Nursing note and vitals reviewed.  neurovascular status found to be intact muscle strength adequate range of motion within normal limits with prominence around the metatarsal shaft right with history of surgery of the right first metatarsal with minimal bunion re-formation after years after procedure. Patient's noted to have good digital perfusion well oriented 3     Assessment:    Probability for irritation from pin of the right first metatarsal which may have spontaneously moved in the last few months     Plan:  H&P x-rays reviewed condition discussed at great length. At this point I recommended pin removal and I did explain that since it's been in there for years it's possible will not be able to get it out and we may end up having to take her to the surgical center. I explained procedure and office nature of this and she wants to do in the office versus surgical center and I did allow her to read consent form going over the procedure and all risk associated with it. Patient scheduled for outpatient surgery to be done in the office understanding risk and the recovery can take 6 months and there is no guarantee this will solve her problem  X-rays indicate it appears to be over the metatarsal shaft where the pain is  located from the previous surgery

## 2017-08-11 ENCOUNTER — Ambulatory Visit: Payer: Medicare Other | Admitting: Podiatry

## 2017-08-15 ENCOUNTER — Telehealth: Payer: Self-pay | Admitting: *Deleted

## 2017-08-15 NOTE — Telephone Encounter (Signed)
"  Good afternoon, I'm having an in office foot surgery on the 8th.  I'm calling to see if there's any restrictions I needed to do.  Please give me a call."  I'm returning your call.  You do not have any restrictions for surgery.  You can eat prior to coming in if you like.  "That's what I wanted to know.  Do I need to be there at 7:45 am or get there a little before?"  You can arrive at 7:45 am.

## 2017-08-18 ENCOUNTER — Encounter: Payer: Self-pay | Admitting: Podiatry

## 2017-08-18 ENCOUNTER — Ambulatory Visit (INDEPENDENT_AMBULATORY_CARE_PROVIDER_SITE_OTHER): Payer: Medicare Other | Admitting: Podiatry

## 2017-08-18 VITALS — BP 143/76 | HR 71 | Temp 96.0°F | Resp 16

## 2017-08-18 DIAGNOSIS — Z472 Encounter for removal of internal fixation device: Secondary | ICD-10-CM

## 2017-08-18 NOTE — Progress Notes (Signed)
Subjective:    Patient ID: Patricia Soto, female   DOB: 71 y.o.   MRN: [id number]   HPI patient presents with prominent area dorsal right with consistency of probable abnormal.    ROS      Objective:  Physical Exam neurovascular status intact negative Homans sign noted with prominent pin position left first metatarsal shaft     Assessment:    Probability for abnormal pin position left dorsal foot with movement which occurred after number of years of fixation     Plan:    Patient was anesthetized with 120 mg Xylocaine Marcaine mixture brought to the OR or sterile prep was applied and tourniquet inflated. I then went ahead and made an incision over the first metatarsal shaft to daily wound with hemostasis being acquired as necessary. I further took and through capsule I exposed the pin found to be loose and I removed the pin in toto. I flushed the wound and sutured with 5-0 nylon and applied sterile dressing releasing tourniquet with capillary fill noted be immediate. I gave instructions on elevation and continued compression and will reappoint 2 weeks for suture removal

## 2017-08-20 ENCOUNTER — Telehealth: Payer: Self-pay | Admitting: *Deleted

## 2017-08-20 NOTE — Telephone Encounter (Signed)
"  I had in office surgery on Monday this week.  I wanted to confirm when I can remove the blue bandage and put on a bandaid.  Is it today or should I wait until Thursday, which will be three complete days and shower instead of bathing.  I'd like a call back."  I'm returning your call.  You can take the bandage off tomorrow and shower.  Put a bandaid over it afterwards.  "When can I put on a regular shoe?"  Whenever you can tolerate it.  "Okay, thanks for calling me back.   You answered my questions."

## 2017-08-22 ENCOUNTER — Other Ambulatory Visit: Payer: Self-pay | Admitting: Internal Medicine

## 2017-08-22 DIAGNOSIS — E2839 Other primary ovarian failure: Secondary | ICD-10-CM

## 2017-08-28 ENCOUNTER — Other Ambulatory Visit: Payer: Medicare Other

## 2017-09-03 ENCOUNTER — Ambulatory Visit (INDEPENDENT_AMBULATORY_CARE_PROVIDER_SITE_OTHER): Payer: Medicare Other

## 2017-09-03 ENCOUNTER — Ambulatory Visit (INDEPENDENT_AMBULATORY_CARE_PROVIDER_SITE_OTHER): Payer: Medicare Other | Admitting: Podiatry

## 2017-09-03 VITALS — BP 149/82 | HR 89 | Resp 16

## 2017-09-03 DIAGNOSIS — Z9889 Other specified postprocedural states: Secondary | ICD-10-CM

## 2017-09-03 DIAGNOSIS — M21619 Bunion of unspecified foot: Secondary | ICD-10-CM | POA: Diagnosis not present

## 2017-09-03 DIAGNOSIS — Z472 Encounter for removal of internal fixation device: Secondary | ICD-10-CM

## 2017-09-03 NOTE — Progress Notes (Signed)
  Subjective:  Patient ID: Patricia Soto, female    DOB: 04-Aug-1946,  MRN: [id number]  Chief Complaint  Patient presents with  . Routine Post Op    DOS 08/18/2017 Removal Pin Rt; SUTURE REMOVAL; pt stated, "Feeling great; has no pain"   71 y.o. female returns for the above complaint. Patient doing great. Denies pain. No issues today.  Objective:   Vitals:   09/03/17 0910  BP: (!) 149/82  Pulse: 89  Resp: 16   General AA&O x3. Normal mood and affect.  Vascular Foot warm and well perfused.  Neurologic Gross sensation intact.  Dermatologic Skin well healed without signs of infection. Sutures intact.  Orthopedic: Tenderness to palpation noted about the surgical site.    Assessment & Plan:  Patient was evaluated and treated and all questions answered.  S/p Removal Pin R Foot -Skin well healed. -Sutures removed. Steris applied. -No soaking for an additional 3 weeks.  F/u PRN.

## 2018-04-07 ENCOUNTER — Other Ambulatory Visit: Payer: Self-pay | Admitting: Internal Medicine

## 2018-04-07 DIAGNOSIS — Z1231 Encounter for screening mammogram for malignant neoplasm of breast: Secondary | ICD-10-CM

## 2018-05-26 ENCOUNTER — Ambulatory Visit
Admission: RE | Admit: 2018-05-26 | Discharge: 2018-05-26 | Disposition: A | Discharge status: Home / Self Care | Payer: Medicare Other | Source: Ambulatory Visit | Attending: Internal Medicine | Admitting: Internal Medicine

## 2018-05-26 DIAGNOSIS — Z1231 Encounter for screening mammogram for malignant neoplasm of breast: Secondary | ICD-10-CM

## 2018-06-30 ENCOUNTER — Ambulatory Visit: Payer: Medicare Other | Admitting: Obstetrics & Gynecology

## 2018-06-30 ENCOUNTER — Encounter: Payer: Self-pay | Admitting: Obstetrics & Gynecology

## 2018-06-30 VITALS — BP 140/80 | Ht 60.0 in | Wt 140.0 lb

## 2018-06-30 DIAGNOSIS — Z780 Asymptomatic menopausal state: Secondary | ICD-10-CM

## 2018-06-30 DIAGNOSIS — Z13820 Encounter for screening for osteoporosis: Secondary | ICD-10-CM

## 2018-06-30 DIAGNOSIS — Z01419 Encounter for gynecological examination (general) (routine) without abnormal findings: Secondary | ICD-10-CM

## 2018-06-30 NOTE — Progress Notes (Signed)
Patricia Soto 1946-04-20 [id number]   History:    72 y.o. G0 Single.  Last trip was to [place].  RP:  Established patient presenting for annual gyn exam   HPI: Menopause, well on no HRT.  No postmenopausal bleeding.  No pelvic pain.  Abstinent.  Urine and bowel movements normal.  Breasts normal.  Patient is very active, walking regularly and doing this Silver Snickers program.  Body mass index 27.34.  In the process of losing a little weight.  Health labs with family physician.  Last colonoscopy in 2012.  Past medical history,surgical history, family history and social history were all reviewed and documented in the EPIC chart.  Gynecologic History No LMP recorded. Patient is postmenopausal. Contraception: abstinence and post menopausal status Last Pap: 2017. Results were: normal Last mammogram: 05/2018. Results were: Negative Bone Density: 09/2014 [facility] Colonoscopy: 2012  Obstetric History OB History  Gravida Para Term Preterm AB Living  0 0 0 0 0 0  SAB TAB Ectopic Multiple Live Births  0 0 0 0 0     ROS: A ROS was performed and pertinent positives and negatives are included in the history.  GENERAL: No fevers or chills. HEENT: No change in vision, no earache, sore throat or sinus congestion. NECK: No pain or stiffness. CARDIOVASCULAR: No chest pain or pressure. No palpitations. PULMONARY: No shortness of breath, cough or wheeze. GASTROINTESTINAL: No abdominal pain, nausea, vomiting or diarrhea, melena or bright red blood per rectum. GENITOURINARY: No urinary frequency, urgency, hesitancy or dysuria. MUSCULOSKELETAL: No joint or muscle pain, no back pain, no recent trauma. DERMATOLOGIC: No rash, no itching, no lesions. ENDOCRINE: No polyuria, polydipsia, no heat or cold intolerance. No recent change in weight. HEMATOLOGICAL: No anemia or easy bruising or bleeding. NEUROLOGIC: No headache, seizures, numbness, tingling or weakness. PSYCHIATRIC: No depression, no loss of interest in  normal activity or change in sleep pattern.     Exam:   BP 140/80   Ht 5' (1.524 m)   Wt 140 lb (63.5 kg)   BMI 27.34 kg/m   Body mass index is 27.34 kg/m.  General appearance : Well developed well nourished female. No acute distress HEENT: Eyes: no retinal hemorrhage or exudates,  Neck supple, trachea midline, no carotid bruits, no thyroidmegaly Lungs: Clear to auscultation, no rhonchi or wheezes, or rib retractions  Heart: Regular rate and rhythm, no murmurs or gallops Breast:Examined in sitting and supine position were symmetrical in appearance, no palpable masses or tenderness,  no skin retraction, no nipple inversion, no nipple discharge, no skin discoloration, no axillary or supraclavicular lymphadenopathy Abdomen: no palpable masses or tenderness, no rebound or guarding Extremities: no edema or skin discoloration or tenderness  Pelvic: Vulva: Normal             Vagina: No gross lesions or discharge  Cervix: No gross lesions or discharge  Uterus  AV, normal size, shape and consistency, non-tender and mobile  Adnexa  Without masses or tenderness  Anus: Normal   Assessment/Plan:  72 y.o. female for annual exam   1. Well female exam with routine gynecological exam Normal gynecologic exam in menopause.  Last Pap test normal in 2017.  No indication to repeat the Pap test today.  Breast exam normal.  Screening mammogram was negative July 2019.  Colonoscopy 2012.  Fasting health labs with family physician.  2. Menopause present Well on no HRT.  No postmenopausal bleeding.  3. Screening for osteoporosis Vitamin D supplements, calcium intake at  1.5 g/day including nutritional and supplemental calcium recommended.  Continuing with regular weightbearing physical activity recommended.  Referral to [facility] for a Bone Density.  Genia DelMarie-Lyne Shawntelle Ungar MD, 12:16 PM 06/30/2018

## 2018-06-30 NOTE — Patient Instructions (Signed)
  1. Well female exam with routine gynecological exam Normal gynecologic exam in menopause.  Last Pap test normal in 2017.  No indication to repeat the Pap test today.  Breast exam normal.  Screening mammogram was negative July 2019.  Colonoscopy 2012.  Fasting health labs with family physician.  2. Menopause present Well on no HRT.  No postmenopausal bleeding.  3. Screening for osteoporosis Vitamin D supplements, calcium intake at 1.5 g/day including nutritional and supplemental calcium recommended.  Continuing with regular weightbearing physical activity recommended.  Referral to [facility] a Bone Density.  Patricia Soto, it was a pleasure seeing you today!

## 2018-07-29 ENCOUNTER — Encounter: Payer: Self-pay | Admitting: Anesthesiology

## 2018-10-01 ENCOUNTER — Ambulatory Visit (INDEPENDENT_AMBULATORY_CARE_PROVIDER_SITE_OTHER): Payer: Medicare Other | Admitting: Internal Medicine

## 2018-10-01 ENCOUNTER — Ambulatory Visit: Payer: Medicare Other

## 2018-10-01 VITALS — BP 144/70 | HR 87 | Temp 97.7°F | Ht 60.0 in | Wt 134.4 lb

## 2018-10-01 DIAGNOSIS — M810 Age-related osteoporosis without current pathological fracture: Secondary | ICD-10-CM

## 2018-10-01 DIAGNOSIS — Z0000 Encounter for general adult medical examination without abnormal findings: Secondary | ICD-10-CM | POA: Diagnosis not present

## 2018-10-01 DIAGNOSIS — Z712 Person consulting for explanation of examination or test findings: Secondary | ICD-10-CM

## 2018-10-01 DIAGNOSIS — I129 Hypertensive chronic kidney disease with stage 1 through stage 4 chronic kidney disease, or unspecified chronic kidney disease: Secondary | ICD-10-CM | POA: Diagnosis not present

## 2018-10-01 DIAGNOSIS — F5101 Primary insomnia: Secondary | ICD-10-CM | POA: Diagnosis not present

## 2018-10-01 DIAGNOSIS — N182 Chronic kidney disease, stage 2 (mild): Secondary | ICD-10-CM

## 2018-10-01 DIAGNOSIS — M8580 Other specified disorders of bone density and structure, unspecified site: Secondary | ICD-10-CM

## 2018-10-01 LAB — POCT UA - MICROALBUMIN
ALBUMIN/CREATININE RATIO, URINE, POC: 30
Creatinine, POC: 300 mg/dL
Microalbumin Ur, POC: 10 mg/L

## 2018-10-01 LAB — POCT URINALYSIS DIPSTICK
BILIRUBIN UA: NEGATIVE
GLUCOSE UA: NEGATIVE
Ketones, UA: 40
LEUKOCYTES UA: NEGATIVE
Nitrite, UA: NEGATIVE
Protein, UA: NEGATIVE
SPEC GRAV UA: 1.02 (ref 1.010–1.025)
UROBILINOGEN UA: 0.2 U/dL
pH, UA: 6 (ref 5.0–8.0)

## 2018-10-01 NOTE — Patient Instructions (Signed)
Patricia Soto , Thank you for taking time to come for your Medicare Wellness Visit. I appreciate your ongoing commitment to your health goals. Please review the following plan we discussed and let me know if I can assist you in the future.   Screening recommendations/referrals: Colonoscopy: 02/2011 Mammogram: 05/2018 Bone Density: 07/2018 Recommended yearly ophthalmology/optometry visit for glaucoma screening and checkup Recommended yearly dental visit for hygiene and checkup  Vaccinations: Influenza vaccine: 08/2018 Pneumococcal vaccine: 05/2015 Tdap vaccine: 11/2009 Shingles vaccine: 05/2014    Advanced directives: Please bring a copy of your POA (Power of [place] City[place]) and/or Living Will to your next appointment.    Conditions/risks identified: Patient wanting to work on keeping blood pressure under control. She is also having trouble with insomnia that she would like to get a handle on  Next appointment: 10/01/2018   Preventive Care 65 Years and Older, Female Preventive care refers to lifestyle choices and visits with your health care provider that can promote health and wellness. What does preventive care include?  A yearly physical exam. This is also called an annual well check.  Dental exams once or twice a year.  Routine eye exams. Ask your health care provider how often you should have your eyes checked.  Personal lifestyle choices, including:  Daily care of your teeth and gums.  Regular physical activity.  Eating a healthy diet.  Avoiding tobacco and drug use.  Limiting alcohol use.  Practicing safe sex.  Taking low-dose aspirin every day.  Taking vitamin and mineral supplements as recommended by your health care provider. What happens during an annual well check? The services and screenings done by your health care provider during your annual well check will depend on your age, overall health, lifestyle risk factors, and family history of disease. Counseling    Your health care provider may ask you questions about your:  Alcohol use.  Tobacco use.  Drug use.  Emotional well-being.  Home and relationship well-being.  Sexual activity.  Eating habits.  History of falls.  Memory and ability to understand (cognition).  Work and work Astronomerenvironment.  Reproductive health. Screening  You may have the following tests or measurements:  Height, weight, and BMI.  Blood pressure.  Lipid and cholesterol levels. These may be checked every 5 years, or more frequently if you are over 72 years old.  Skin check.  Lung cancer screening. You may have this screening every year starting at age 72 if you have a 30-pack-year history of smoking and currently smoke or have quit within the past 15 years.  Fecal occult blood test (FOBT) of the stool. You may have this test every year starting at age 72.  Flexible sigmoidoscopy or colonoscopy. You may have a sigmoidoscopy every 5 years or a colonoscopy every 10 years starting at age 72.  Hepatitis C blood test.  Hepatitis B blood test.  Sexually transmitted disease (STD) testing.  Diabetes screening. This is done by checking your blood sugar (glucose) after you have not eaten for a while (fasting). You may have this done every 1-3 years.  Bone density scan. This is done to screen for osteoporosis. You may have this done starting at age 72.  Mammogram. This may be done every 1-2 years. Talk to your health care provider about how often you should have regular mammograms. Talk with your health care provider about your test results, treatment options, and if necessary, the need for more tests. Vaccines  Your health care provider may recommend certain vaccines, such  as:  Influenza vaccine. This is recommended every year.  Tetanus, diphtheria, and acellular pertussis (Tdap, Td) vaccine. You may need a Td booster every 10 years.  Zoster vaccine. You may need this after age 27.  Pneumococcal 13-valent  conjugate (PCV13) vaccine. One dose is recommended after age 101.  Pneumococcal polysaccharide (PPSV23) vaccine. One dose is recommended after age 1. Talk to your health care provider about which screenings and vaccines you need and how often you need them. This information is not intended to replace advice given to you by your health care provider. Make sure you discuss any questions you have with your health care provider. Document Released: 11/24/2015 Document Revised: 07/17/2016 Document Reviewed: 08/29/2015 Elsevier Interactive Patient Education  2017 [place] Prevention in the Home Falls can cause injuries. They can happen to people of all ages. There are many things you can do to make your home safe and to help prevent falls. What can I do on the outside of my home?  Regularly fix the edges of walkways and driveways and fix any cracks.  Remove anything that might make you trip as you walk through a door, such as a raised step or threshold.  Trim any bushes or trees on the path to your home.  Use bright outdoor lighting.  Clear any walking paths of anything that might make someone trip, such as rocks or tools.  Regularly check to see if handrails are loose or broken. Make sure that both sides of any steps have handrails.  Any raised decks and porches should have guardrails on the edges.  Have any leaves, snow, or ice cleared regularly.  Use sand or salt on walking paths during winter.  Clean up any spills in your garage right away. This includes oil or grease spills. What can I do in the bathroom?  Use night lights.  Install grab bars by the toilet and in the tub and shower. Do not use towel bars as grab bars.  Use non-skid mats or decals in the tub or shower.  If you need to sit down in the shower, use a plastic, non-slip stool.  Keep the floor dry. Clean up any water that spills on the floor as soon as it happens.  Remove soap buildup in the tub or  shower regularly.  Attach bath mats securely with double-sided non-slip rug tape.  Do not have throw rugs and other things on the floor that can make you trip. What can I do in the bedroom?  Use night lights.  Make sure that you have a light by your bed that is easy to reach.  Do not use any sheets or blankets that are too big for your bed. They should not hang down onto the floor.  Have a firm chair that has side arms. You can use this for support while you get dressed.  Do not have throw rugs and other things on the floor that can make you trip. What can I do in the kitchen?  Clean up any spills right away.  Avoid walking on wet floors.  Keep items that you use a lot in easy-to-reach places.  If you need to reach something above you, use a strong step stool that has a grab bar.  Keep electrical cords out of the way.  Do not use floor polish or wax that makes floors slippery. If you must use wax, use non-skid floor wax.  Do not have throw rugs and other things on the  floor that can make you trip. What can I do with my stairs?  Do not leave any items on the stairs.  Make sure that there are handrails on both sides of the stairs and use them. Fix handrails that are broken or loose. Make sure that handrails are as long as the stairways.  Check any carpeting to make sure that it is firmly attached to the stairs. Fix any carpet that is loose or worn.  Avoid having throw rugs at the top or bottom of the stairs. If you do have throw rugs, attach them to the floor with carpet tape.  Make sure that you have a light switch at the top of the stairs and the bottom of the stairs. If you do not have them, ask someone to add them for you. What else can I do to help prevent falls?  Wear shoes that:  Do not have high heels.  Have rubber bottoms.  Are comfortable and fit you well.  Are closed at the toe. Do not wear sandals.  If you use a stepladder:  Make sure that it is fully  opened. Do not climb a closed stepladder.  Make sure that both sides of the stepladder are locked into place.  Ask someone to hold it for you, if possible.  Clearly mark and make sure that you can see:  Any grab bars or handrails.  First and last steps.  Where the edge of each step is.  Use tools that help you move around (mobility aids) if they are needed. These include:  Canes.  Walkers.  Scooters.  Crutches.  Turn on the lights when you go into a dark area. Replace any light bulbs as soon as they burn out.  Set up your furniture so you have a clear path. Avoid moving your furniture around.  If any of your floors are uneven, fix them.  If there are any pets around you, be aware of where they are.  Review your medicines with your doctor. Some medicines can make you feel dizzy. This can increase your chance of falling. Ask your doctor what other things that you can do to help prevent falls. This information is not intended to replace advice given to you by your health care provider. Make sure you discuss any questions you have with your health care provider. Document Released: 08/24/2009 Document Revised: 04/04/2016 Document Reviewed: 12/02/2014 Elsevier Interactive Patient Education  2017 [employer].

## 2018-10-01 NOTE — Patient Instructions (Signed)

## 2018-10-01 NOTE — Progress Notes (Signed)
Subjective:   Patricia Soto is a 72 y.o. female who presents for Medicare Annual (Subsequent) preventive examination.  Review of Systems:  n/a Cardiac Risk Factors include: advanced age (>45men, >62 women);hypertension     Objective:     Vitals: BP (!) 144/70 (Patient Position: Sitting)   Pulse 87   Temp 97.7 F (36.5 C)   Ht 5' (1.524 m)   Wt 133 lb 6.4 oz (60.5 kg)   BMI 26.05 kg/m   Body mass index is 26.05 kg/m.  Advanced Directives 10/01/2018 10/30/2015 10/20/2015  Does Patient Have a Medical Advance Directive? Yes - Yes  Type of Advance Directive Living will - Living will;Healthcare Power of Attorney  Does patient want to make changes to medical advance directive? No - Patient declined - No - Patient declined  Copy of Healthcare Power of Attorney in Chart? - No - copy requested No - copy requested    Tobacco Social History   Tobacco Use  Smoking Status Never Smoker  Smokeless Tobacco Never Used     Counseling given: Not Answered   Clinical Intake:  Pre-visit preparation completed: Yes  Pain : No/denies pain Pain Score: 0-No pain     Nutritional Status: BMI 25 -29 Overweight Nutritional Risks: None Diabetes: No  How often do you need to have someone help you when you read instructions, pamphlets, or other written materials from your doctor or pharmacy?: 1 - Never What is the last grade level you completed in school?: Graduate Degree  Interpreter Needed?: No  Information entered by :: NAllen LPN  Past Medical History:  Diagnosis Date  . Anemia    hx teen  . Arthritis   . Heart murmur    hx  . Hypertension    Past Surgical History:  Procedure Laterality Date  . COLPOSCOPY  1991  . FOOT SURGERY Bilateral    bunions93,2013 rt,94 ,2012,lft toes2008lft  . MYOMECTOMY  90  . PARATHYROIDECTOMY N/A 10/30/2015   Procedure: PARATHYROIDECTOMY;  Surgeon: Darnell Level, MD;  Location: [facility] OR;  Service: General;  Laterality: N/A;   Family History    Problem Relation Age of Onset  . Diabetes Father   . Congenital heart disease Father   . Hypertension Father   . Heart attack Father   . Hypertension Sister   . Hypertension Sister    Social History   Socioeconomic History  . Marital status: Single    Spouse name: Not on file  . Number of children: Not on file  . Years of education: Not on file  . Highest education level: Not on file  Occupational History  . Occupation: part time  Social Needs  . Financial resource strain: Not hard at all  . Food insecurity:    Worry: Never true    Inability: Never true  . Transportation needs:    Medical: No    Non-medical: No  Tobacco Use  . Smoking status: Never Smoker  . Smokeless tobacco: Never Used  Substance and Sexual Activity  . Alcohol use: Yes    Alcohol/week: 3.0 standard drinks    Types: 3 Glasses of wine per week    Comment: WINE -   . Drug use: No  . Sexual activity: Not Currently    Partners: Male    Comment: 1ST  intercourse- 26, partners - refused to answer   Lifestyle  . Physical activity:    Days per week: 5 days    Minutes per session: 80 min  . Stress:  Not at all  Relationships  . Social connections:    Talks on phone: Not on file    Gets together: Not on file    Attends religious service: Not on file    Active member of club or organization: Not on file    Attends meetings of clubs or organizations: Not on file    Relationship status: Not on file  Other Topics Concern  . Not on file  Social History Narrative  . Not on file    Outpatient Encounter Medications as of 10/01/2018  Medication Sig  . cholecalciferol (VITAMIN D) 1000 units tablet Take 1,000 Units by mouth daily.  . Ginger, Zingiber officinalis, (GINGER ROOT) 550 MG CAPS Take 1 capsule by mouth daily.  . magnesium 30 MG tablet Take 30 mg by mouth 2 (two) times daily.  . Omega-3 Fatty Acids (FISH OIL) 1000 MG CAPS Take by mouth.  . telmisartan (MICARDIS) 20 MG tablet Take 20 mg by mouth  daily.  . Turmeric 1053 MG TABS Take by mouth.  . Wheat Dextrin (BENEFIBER ON THE GO) PACK Take 1 packet by mouth 2 (two) times daily.   No facility-administered encounter medications on file as of 10/01/2018.     Activities of Daily Living In your present state of health, do you have any difficulty performing the following activities: 10/01/2018  Hearing? N  Vision? N  Difficulty concentrating or making decisions? N  Walking or climbing stairs? N  Dressing or bathing? N  Doing errands, shopping? N  Preparing Food and eating ? N  Using the Toilet? N  In the past six months, have you accidently leaked urine? Y  Comment hold too long  Do you have problems with loss of bowel control? N  Managing your Medications? N  Managing your Finances? N  Housekeeping or managing your Housekeeping? N  Some recent data might be hidden    Patient Care Team: Dorothyann PengSanders, Robyn, MD as PCP - General (Internal Medicine)    Assessment:   This is a routine wellness examination for Patricia Soto.  Exercise Activities and Dietary recommendations Current Exercise Habits: Structured exercise class, Type of exercise: calisthenics;strength training/weights, Time (Minutes): > 60, Frequency (Times/Week): 5, Weekly Exercise (Minutes/Week): 0, Intensity: Moderate, Exercise limited by: None identified  Goals    . Blood Pressure < 140/90 (pt-stated)       Fall Risk Fall Risk  10/01/2018  Falls in the past year? 0  Risk for fall due to : Medication side effect   Is the patient's home free of loose throw rugs in walkways, pet beds, electrical cords, etc?   yes      Grab bars in the bathroom? yes      Handrails on the stairs?   yes      Adequate lighting?   yes  Timed Get Up and Go performed: n/a  Depression Screen PHQ 2/9 Scores 10/01/2018  PHQ - 2 Score 0  PHQ- 9 Score 1     Cognitive Function     6CIT Screen 10/01/2018  What Year? 0 points  What month? 0 points  What time? 0 points  Count back from 20  0 points  Months in reverse 0 points  Repeat phrase 0 points  Total Score 0    Immunization History  Administered Date(s) Administered  . Influenza, High Dose Seasonal PF 08/29/2018  . Pneumococcal Conjugate-13 06/08/2015  . Pneumococcal Polysaccharide-23 04/29/2014  . Tdap 11/20/2009  . Zoster 05/24/2014    Qualifies for Shingles  Vaccine? yes  Screening Tests Health Maintenance  Topic Date Due  . Hepatitis C Screening  February 08, 1946  . TETANUS/TDAP  11/21/2019  . MAMMOGRAM  05/26/2020  . COLONOSCOPY  03/04/2021  . INFLUENZA VACCINE  Completed  . DEXA SCAN  Completed  . PNA vac Low Risk Adult  Completed    Cancer Screenings: Lung: Low Dose CT Chest recommended if Age 46-80 years, 30 pack-year currently smoking OR have quit w/in 15years. Patient does not qualify. Breast:  Up to date on Mammogram? Yes   Up to date of Bone Density/Dexa? Yes Colorectal: up to date  Additional Screenings: : Hepatitis C Screening: due     Plan:    Patient wanting to work on keeping BP under control and have better sleep.   I have personally reviewed and noted the following in the patient's chart:   . Medical and social history . Use of alcohol, tobacco or illicit drugs  . Current medications and supplements . Functional ability and status . Nutritional status . Physical activity . Advanced directives . List of other physicians . Hospitalizations, surgeries, and ER visits in previous 12 months . Vitals . Screenings to include cognitive, depression, and falls . Referrals and appointments  In addition, I have reviewed and discussed with patient certain preventive protocols, quality metrics, and best practice recommendations. A written personalized care plan for preventive services as well as general preventive health recommendations were provided to patient.     Barb Merino, LPN  16/08/[id number]

## 2018-10-02 LAB — LIPID PANEL
CHOLESTEROL TOTAL: 226 mg/dL — AB (ref 100–199)
Chol/HDL Ratio: 2.9 ratio (ref 0.0–4.4)
HDL: 78 mg/dL (ref >39)
LDL Calculated: 128 mg/dL — ABNORMAL HIGH (ref 0–99)
Triglycerides: 98 mg/dL (ref 0–149)
VLDL CHOLESTEROL CAL: 20 mg/dL (ref 5–40)

## 2018-10-02 LAB — CMP14+EGFR
ALBUMIN: 4.2 g/dL (ref 3.5–4.8)
ALK PHOS: 48 IU/L (ref 39–117)
ALT: 14 IU/L (ref 0–32)
AST: 19 IU/L (ref 0–40)
Albumin/Globulin Ratio: 1.6 (ref 1.2–2.2)
BILIRUBIN TOTAL: 0.7 mg/dL (ref 0.0–1.2)
BUN / CREAT RATIO: 16 (ref 12–28)
BUN: 13 mg/dL (ref 8–27)
CHLORIDE: 106 mmol/L (ref 96–106)
CO2: 20 mmol/L (ref 20–29)
Calcium: 9.3 mg/dL (ref 8.7–10.3)
Creatinine, Ser: 0.82 mg/dL (ref 0.57–1.00)
GFR calc Af Amer: 83 mL/min/{1.73_m2} (ref >59)
GFR calc non Af Amer: 72 mL/min/{1.73_m2} (ref >59)
GLUCOSE: 86 mg/dL (ref 65–99)
Globulin, Total: 2.7 g/dL (ref 1.5–4.5)
Potassium: 3.4 mmol/L — ABNORMAL LOW (ref 3.5–5.2)
Sodium: 144 mmol/L (ref 134–144)
Total Protein: 6.9 g/dL (ref 6.0–8.5)

## 2018-10-03 ENCOUNTER — Encounter: Payer: Self-pay | Admitting: Internal Medicine

## 2018-10-03 DIAGNOSIS — I129 Hypertensive chronic kidney disease with stage 1 through stage 4 chronic kidney disease, or unspecified chronic kidney disease: Secondary | ICD-10-CM | POA: Insufficient documentation

## 2018-10-03 DIAGNOSIS — M810 Age-related osteoporosis without current pathological fracture: Secondary | ICD-10-CM

## 2018-10-03 DIAGNOSIS — M8580 Other specified disorders of bone density and structure, unspecified site: Secondary | ICD-10-CM | POA: Insufficient documentation

## 2018-10-03 DIAGNOSIS — N182 Chronic kidney disease, stage 2 (mild): Secondary | ICD-10-CM | POA: Insufficient documentation

## 2018-10-03 DIAGNOSIS — F5101 Primary insomnia: Secondary | ICD-10-CM | POA: Insufficient documentation

## 2018-10-03 NOTE — Progress Notes (Signed)
Subjective:     Patient ID: Patricia Soto , adult    DOB: Jul 10, 1946 , 72 y.o.   MRN: [id number]   Chief Complaint  Patient presents with  . Hypertension    HPI  Hypertension  This is a chronic problem. The current episode started more than 1 year ago. The problem has been gradually improving since onset. The problem is controlled. Pertinent negatives include no blurred vision, chest pain, headaches, orthopnea or palpitations. The current treatment provides mild improvement. There are no compliance problems.    She reports compliance with meds. She has brought in BP log. Most of her readings are less than 130/80.   Past Medical History:  Diagnosis Date  . Anemia    hx teen  . Arthritis   . Heart murmur    hx  . Hypertension      Family History  Problem Relation Age of Onset  . Diabetes Father   . Congenital heart disease Father   . Hypertension Father   . Heart attack Father   . Hypertension Sister   . Hypertension Sister      Current Outpatient Medications:  .  cholecalciferol (VITAMIN D) 1000 units tablet, Take 1,000 Units by mouth daily., Disp: , Rfl:  .  Ginger, Zingiber officinalis, (GINGER ROOT) 550 MG CAPS, Take 1 capsule by mouth daily., Disp: , Rfl:  .  magnesium 30 MG tablet, Take 30 mg by mouth 2 (two) times daily., Disp: , Rfl:  .  Omega-3 Fatty Acids (FISH OIL) 1000 MG CAPS, Take by mouth., Disp: , Rfl:  .  telmisartan (MICARDIS) 20 MG tablet, Take 20 mg by mouth daily., Disp: , Rfl:  .  Turmeric 1053 MG TABS, Take by mouth., Disp: , Rfl:  .  Wheat Dextrin (BENEFIBER ON THE GO) PACK, Take 1 packet by mouth 2 (two) times daily., Disp: , Rfl:    No Known Allergies   Review of Systems  Constitutional: Negative.   Eyes: Negative for blurred vision.  Respiratory: Negative.   Cardiovascular: Negative.  Negative for chest pain, palpitations and orthopnea.  Gastrointestinal: Negative.   Neurological: Negative.  Negative for headaches.   Psychiatric/Behavioral: Negative.      Today's Vitals   10/01/18 1426  BP: (!) 144/70  Pulse: 87  Temp: 97.7 F (36.5 C)  TempSrc: Oral  Weight: 134 lb 6.4 oz (61 kg)  Height: 5' (1.524 m)  PainSc: 0-No pain   Body mass index is 26.25 kg/m.   Objective:  Physical Exam  Constitutional: She is oriented to person, place, and time. She appears well-developed and well-nourished.  HENT:  Head: Normocephalic and atraumatic.  Eyes: EOM are normal.  Cardiovascular: Normal rate, regular rhythm and normal heart sounds.  Pulmonary/Chest: Effort normal and breath sounds normal.  Neurological: She is alert and oriented to person, place, and time.  Psychiatric: She has a normal mood and affect.  Nursing note and vitals reviewed.       Assessment And Plan:     1. Hypertensive nephropathy  Fair control. She will continue with current meds. She is encouraged to avoid adding salt to her foods. She is also encouraged to continue with her regular exercise regimen.   - CMP14+EGFR - Lipid Profile - POCT UA - Microalbumin - POCT Urinalysis Dipstick (81002)  2. Chronic renal disease, stage II  Chronic. She is encouraged to stay well hydrated.  3. Primary insomnia  Chronic .She is encouraged to develop good bedtime hygiene. She will also  resume melatonin and continue with mg supplementation.   4. Osteopenia after menopause  We reviewed her most recent dexa scan. Importance of weight-bearing exercises and calcium/vitamin D supplementation was discussed with the patient. She will have a repeat study in 2 years.   Maximino Greenland, MD

## 2018-10-04 NOTE — Progress Notes (Signed)
Here are your lab results:  Your liver and kidney function are stable. Your potassium level is slightly low. Please increase your intake of potassium rich foods. I can send you a list if needed. Your last lipid panel is fair. Your LDL, bad cholesterol, is 128. Ideally, this should be less than 100.  Continue to exercise regularly. You may benefit from taking a fiber supplement.   Happy Thanksgiving!  Sincerely,    Female Minish N. Allyne GeeSanders, MD

## 2019-01-21 ENCOUNTER — Other Ambulatory Visit: Payer: Self-pay | Admitting: Internal Medicine

## 2019-03-23 ENCOUNTER — Telehealth: Payer: Self-pay

## 2019-03-23 NOTE — Telephone Encounter (Signed)
I returned the pt's call to schedule her for a virtual appointment if she agrees because she has canceled her appt.

## 2019-03-23 NOTE — Telephone Encounter (Signed)
The patient responded to the MyChart message that I sent and said yes she would like to do her appt virtually.

## 2019-03-31 ENCOUNTER — Other Ambulatory Visit: Payer: Self-pay

## 2019-03-31 ENCOUNTER — Ambulatory Visit: Payer: Medicare Other | Admitting: Internal Medicine

## 2019-03-31 ENCOUNTER — Encounter: Payer: Self-pay | Admitting: Internal Medicine

## 2019-03-31 VITALS — BP 123/67 | HR 69 | Temp 96.8°F | Ht 60.0 in | Wt 133.0 lb

## 2019-03-31 DIAGNOSIS — M810 Age-related osteoporosis without current pathological fracture: Secondary | ICD-10-CM | POA: Diagnosis not present

## 2019-03-31 DIAGNOSIS — E663 Overweight: Secondary | ICD-10-CM | POA: Diagnosis not present

## 2019-03-31 DIAGNOSIS — N182 Chronic kidney disease, stage 2 (mild): Secondary | ICD-10-CM | POA: Diagnosis not present

## 2019-03-31 DIAGNOSIS — I129 Hypertensive chronic kidney disease with stage 1 through stage 4 chronic kidney disease, or unspecified chronic kidney disease: Secondary | ICD-10-CM | POA: Diagnosis not present

## 2019-03-31 DIAGNOSIS — M8580 Other specified disorders of bone density and structure, unspecified site: Secondary | ICD-10-CM

## 2019-03-31 MED ORDER — TELMISARTAN 20 MG PO TABS: 20.0000 mg | ORAL_TABLET | Freq: Every day | ORAL | 2 refills | Status: DC

## 2019-03-31 NOTE — Patient Instructions (Addendum)
Exercising to Stay Healthy To become healthy and stay healthy, it is recommended that you do moderate-intensity and vigorous-intensity exercise. You can tell that you are exercising at a moderate intensity if your heart starts beating faster and you start breathing faster but can still hold a conversation. You can tell that you are exercising at a vigorous intensity if you are breathing much harder and faster and cannot hold a conversation while exercising. Exercising regularly is important. It has many health benefits, such as:  Improving overall fitness, flexibility, and endurance.  Increasing bone density.  Helping with weight control.  Decreasing body fat.  Increasing muscle strength.  Reducing stress and tension.  Improving overall health. How often should I exercise? Choose an activity that you enjoy, and set realistic goals. Your health care provider can help you make an activity plan that works for you. Exercise regularly as told by your health care provider. This may include:  Doing strength training two times a week, such as: ? Lifting weights. ? Using resistance bands. ? Push-ups. ? Sit-ups. ? Yoga.  Doing a certain intensity of exercise for a given amount of time. Choose from these options: ? A total of 150 minutes of moderate-intensity exercise every week. ? A total of 75 minutes of vigorous-intensity exercise every week. ? A mix of moderate-intensity and vigorous-intensity exercise every week. Children, pregnant women, people who have not exercised regularly, people who are overweight, and older adults may need to talk with a health care provider about what activities are safe to do. If you have a medical condition, be sure to talk with your health care provider before you start a new exercise program. What are some exercise ideas? Moderate-intensity exercise ideas include:  Walking 1 mile (1.6 km) in about 15 minutes.  Biking.  Hiking.  Golfing.  Dancing.   Water aerobics. Vigorous-intensity exercise ideas include:  Walking 4.5 miles (7.2 km) or more in about 1 hour.  Jogging or running 5 miles (8 km) in about 1 hour.  Biking 10 miles (16.1 km) or more in about 1 hour.  Lap swimming.  Roller-skating or in-line skating.  Cross-country skiing.  Vigorous competitive sports, such as football, basketball, and soccer.  Jumping rope.  Aerobic dancing. What are some everyday activities that can help me to get exercise?  Yard work, such as: ? Pushing a lawn mower. ? Raking and bagging leaves.  Washing your car.  Pushing a stroller.  Shoveling snow.  Gardening.  Washing windows or floors. How can I be more active in my day-to-day activities?  Use stairs instead of an elevator.  Take a walk during your lunch break.  If you drive, park your car farther away from your work or school.  If you take public transportation, get off one stop early and walk the rest of the way.  Stand up or walk around during all of your indoor phone calls.  Get up, stretch, and walk around every 30 minutes throughout the day.  Enjoy exercise with a friend. Support to continue exercising will help you keep a regular routine of activity. What guidelines can I follow while exercising?  Before you start a new exercise program, talk with your health care provider.  Do not exercise so much that you hurt yourself, feel dizzy, or get very short of breath.  Wear comfortable clothes and wear shoes with good support.  Drink plenty of water while you exercise to prevent dehydration or heat stroke.  Work out until your breathing   and your heartbeat get faster. Where to find more information  [place] Department of Health and Human Services: ThisPath.fiwww.hhs.gov  Centers for Disease Control and Prevention (CDC): FootballExhibition.com.brwww.cdc.gov Summary  Exercising regularly is important. It will improve your overall fitness, flexibility, and endurance.  Regular exercise also will  improve your overall health. It can help you control your weight, reduce stress, and improve your bone density.  Do not exercise so much that you hurt yourself, feel dizzy, or get very short of breath.  Before you start a new exercise program, talk with your health care provider. This information is not intended to replace advice given to you by your health care provider. Make sure you discuss any questions you have with your health care provider. Document Released: 11/30/2010 Document Revised: 09/18/2017 Document Reviewed: 09/18/2017 Elsevier Interactive Patient Education  2019 [employer]  Osteopenia  Osteopenia is a loss of thickness (density) inside of the bones. Another name for osteopenia is low bone mass. Mild osteopenia is a normal part of aging. It is not a disease, and it does not cause symptoms. However, if you have osteopenia and continue to lose bone mass, you could develop a condition that causes the bones to become thin and break more easily (osteoporosis). You may also lose some height, have back pain, and have a stooped posture. Although osteopenia is not a disease, making changes to your lifestyle and diet can help to prevent osteopenia from developing into osteoporosis. What are the causes? Osteopenia is caused by loss of calcium in the bones.  Bones are constantly changing. Old bone cells are continually being replaced with new bone cells. This process builds new bone. The mineral calcium is needed to build new bone and maintain bone density. Bone density is usually highest around age 535. After that, most people's bodies cannot replace all the bone they have lost with new bone. What increases the risk? You are more likely to develop this condition if:  You are older than age 73.  You are a woman who went through menopause early.  You have a long illness that keeps you in bed.  You do not get enough exercise.  You lack certain nutrients (malnutrition).  You have an  overactive thyroid gland (hyperthyroidism).  You smoke.  You drink a lot of alcohol.  You are taking medicines that weaken the bones, such as steroids. What are the signs or symptoms? This condition does not cause any symptoms. You may have a slightly higher risk for bone breaks (fractures), so getting fractures more easily than normal may be an indication of osteopenia. How is this diagnosed? Your health care provider can diagnose this condition with a special type of X-ray exam that measures bone density (dual-energy X-ray absorptiometry, DEXA). This test can measure bone density in your hips, spine, and wrists. Osteopenia has no symptoms, so this condition is usually diagnosed after a routine bone density screening test is done for osteoporosis. This routine screening is usually done for:  Women who are age 73 or older.  Men who are age 73 or older. If you have risk factors for osteopenia, you may have the screening test at an earlier age. How is this treated? Making dietary and lifestyle changes can lower your risk for osteoporosis. If you have severe osteopenia that is close to becoming osteoporosis, your health care provider may prescribe medicines and dietary supplements such as calcium and vitamin D. These supplements help to rebuild bone density. Follow these instructions at home:  Take over-the-counter and prescription medicines only as told by your health care provider. These include vitamins and supplements.  Eat a diet that is high in calcium and vitamin D. ? Calcium is found in dairy products, beans, salmon, and leafy green vegetables like spinach and broccoli. ? Look for foods that have vitamin D and calcium added to them (fortified foods), such as orange juice, cereal, and bread.  Do 30 or more minutes of a weight-bearing exercise every day, such as walking, jogging, or playing a sport. These types of exercises strengthen the bones.  Take precautions at home to lower  your risk of falling, such as: ? Keeping rooms well-lit and free of clutter, such as cords. ? Installing safety rails on stairs. ? Using rubber mats in the bathroom or other areas that are often wet or slippery.  Do not use any products that contain nicotine or tobacco, such as cigarettes and e-cigarettes. If you need help quitting, ask your health care provider.  Avoid alcohol or limit alcohol intake to no more than 1 drink a day for nonpregnant women and 2 drinks a day for men. One drink equals 12 oz of beer, 5 oz of wine, or 1 oz of hard liquor.  Keep all follow-up visits as told by your health care provider. This is important. Contact a health care provider if:  You have not had a bone density screening for osteoporosis and you are: ? A woman, age 32 or older. ? A man, age 60 or older.  You are a postmenopausal woman who has not had a bone density screening for osteoporosis.  You are older than age 44 and you want to know if you should have bone density screening for osteoporosis. Summary  Osteopenia is a loss of thickness (density) inside of the bones. Another name for osteopenia is low bone mass.  Osteopenia is not a disease, but it may increase your risk for a condition that causes the bones to become thin and break more easily (osteoporosis).  You may be at risk for osteopenia if you are older than age 43 or if you are a woman who went through early menopause.  Osteopenia does not cause any symptoms, but it can be diagnosed with a bone density screening test.  Dietary and lifestyle changes are the first treatment for osteopenia. These may lower your risk for osteoporosis. This information is not intended to replace advice given to you by your health care provider. Make sure you discuss any questions you have with your health care provider. Document Released: 08/06/2017 Document Revised: 08/06/2017 Document Reviewed: 08/06/2017 Elsevier Interactive Patient Education  2019  [employer].

## 2019-04-01 ENCOUNTER — Ambulatory Visit: Payer: Medicare Other | Admitting: Internal Medicine

## 2019-04-03 NOTE — Progress Notes (Signed)
Virtual Visit via Video   This visit type was conducted due to national recommendations for restrictions regarding the COVID-19 Pandemic (e.g. social distancing) in an effort to limit this patient's exposure and mitigate transmission in our community.  Due to her co-morbid illnesses, this patient is at least at moderate risk for complications without adequate follow up.  This format is felt to be most appropriate for this patient at this time.  All issues noted in this document were discussed and addressed.  A limited physical exam was performed with this format.    This visit type was conducted due to national recommendations for restrictions regarding the COVID-19 Pandemic (e.g. social distancing) in an effort to limit this patient's exposure and mitigate transmission in our community.  Patients identity confirmed using two different identifiers.  This format is felt to be most appropriate for this patient at this time.  All issues noted in this document were discussed and addressed.  No physical exam was performed (except for noted visual exam findings with Video Visits).    Date:  04/03/2019   ID:  NYLEE BARBUTO, DOB Feb 18, 1946, MRN [id number]  Patient Location:  Home  Provider location:   Office    Chief Complaint:  HTN f/u  History of Present Illness:    WERONIKA BIRCH is a 73 y.o. female who presents via video conferencing for a telehealth visit today.    The patient does not have symptoms concerning for COVID-19 infection (fever, chills, cough, or new shortness of breath).   She presents today for virtual visit. She prefers this method of contact due to COVID-19 pandemic.  She presents today for htn f/u.   Hypertension  This is a chronic problem. The current episode started more than 1 year ago. The problem has been gradually improving since onset. The problem is controlled. Pertinent negatives include no blurred vision, chest pain, palpitations or shortness of breath. Past  treatments include angiotensin blockers and diuretics. The current treatment provides moderate improvement. There are no compliance problems.      Past Medical History:  Diagnosis Date  . Anemia    hx teen  . Arthritis   . Heart murmur    hx  . Hypertension    Past Surgical History:  Procedure Laterality Date  . COLPOSCOPY  1991  . FOOT SURGERY Bilateral    bunions93,2013 rt,94 ,2012,lft toes2008lft  . MYOMECTOMY  90  . PARATHYROIDECTOMY N/A 10/30/2015   Procedure: PARATHYROIDECTOMY;  Surgeon: Armandina Gemma, MD;  Location: [facility];  Service: General;  Laterality: N/A;     Current Meds  Medication Sig  . cholecalciferol (VITAMIN D) 1000 units tablet Take 1,000 Units by mouth daily.  . Ginger, Zingiber officinalis, (GINGER ROOT) 550 MG CAPS Take 1 capsule by mouth daily.  . magnesium 30 MG tablet Take 30 mg by mouth 2 (two) times daily.  Marland Kitchen telmisartan (MICARDIS) 20 MG tablet Take 1 tablet (20 mg total) by mouth daily.  . Turmeric 1053 MG TABS Take by mouth.  . Wheat Dextrin (BENEFIBER ON THE GO) PACK Take 1 packet by mouth 2 (two) times daily.  . [DISCONTINUED] telmisartan (MICARDIS) 20 MG tablet TAKE 1 TABLET BY MOUTH EVERY DAY     Allergies:   Patient has no known allergies.   Social History   Tobacco Use  . Smoking status: Never Smoker  . Smokeless tobacco: Never Used  Substance Use Topics  . Alcohol use: Yes    Alcohol/week: 3.0 standard drinks  Types: 3 Glasses of wine per week    Comment: WINE -   . Drug use: No     Family Hx: The patient's family history includes Congenital heart disease in her father; Diabetes in her father; Heart attack in her father; Hypertension in her father, mother, sister, and sister; Other in her mother.  ROS:   Please see the history of present illness.    Review of Systems  Constitutional: Negative.   Eyes: Negative for blurred vision.  Respiratory: Negative.  Negative for shortness of breath.   Cardiovascular: Negative.  Negative  for chest pain and palpitations.  Gastrointestinal: Negative.   Neurological: Negative.   Psychiatric/Behavioral: Negative.     All other systems reviewed and are negative.   Labs/Other Tests and Data Reviewed:    Recent Labs: 10/01/2018: ALT 14; BUN 13; Creatinine, Ser 0.82; Potassium 3.4; Sodium 144   Recent Lipid Panel Lab Results  Component Value Date/Time   CHOL 226 (H) 10/01/2018 03:23 PM   TRIG 98 10/01/2018 03:23 PM   HDL 78 10/01/2018 03:23 PM   CHOLHDL 2.9 10/01/2018 03:23 PM   LDLCALC 128 (H) 10/01/2018 03:23 PM    Wt Readings from Last 3 Encounters:  03/31/19 133 lb (60.3 kg)  10/01/18 134 lb 6.4 oz (61 kg)  10/01/18 133 lb 6.4 oz (60.5 kg)     Exam:    Vital Signs:  BP 123/67 (BP Location: Left Arm, Patient Position: Sitting, Cuff Size: Normal)   Pulse 69 Comment: pt provided  Temp (!) 96.8 F (36 C) (Oral) Comment: pt provided  Ht 5' (1.524 m)   Wt 133 lb (60.3 kg) Comment: pt provided  BMI 25.97 kg/m     Physical Exam  Constitutional: She is oriented to person, place, and time and well-developed, well-nourished, and in no distress.  HENT:  Head: Normocephalic and atraumatic.  Neck: Normal range of motion.  Pulmonary/Chest: Effort normal.  Neurological: She is alert and oriented to person, place, and time.  Psychiatric: Affect normal.  Nursing note and vitals reviewed.   ASSESSMENT & PLAN:     1. Hypertensive nephropathy  Well controlled. She did email bp log prior to her visit, this was reviewed in full detail. She will continue with current meds. She agrees to stop in next week for bloodwork.   - CMP14+EGFR; Future - Lipid panel; Future - Hepatitis C antibody; Future  2. Chronic renal disease, stage II  Chronic, I will check GFR, Cr today.   3. Osteopenia after menopause  We reviewed her most recent Dexa scan results. She will continue with her regular walking program, along with calcium and vitamin D supplementation.    4.  Overweight with body mass index (BMI) 25.0-29.9  Her weight is stable.     COVID-19 Education: The signs and symptoms of COVID-19 were discussed with the patient and how to seek care for testing (follow up with PCP or arrange E-visit).  The importance of social distancing was discussed today.  Patient Risk:   After full review of this patients clinical status, I feel that they are at least moderate risk at this time.  Time:   Today, I have spent 12 minutes/ 23 seconds with the patient with telehealth technology discussing above diagnoses.     Medication Adjustments/Labs and Tests Ordered: Current medicines are reviewed at length with the patient today.  Concerns regarding medicines are outlined above.   Tests Ordered: Orders Placed This Encounter  Procedures  . CMP14+EGFR  .  Lipid panel  . Hepatitis C antibody    Medication Changes: Meds ordered this encounter  Medications  . telmisartan (MICARDIS) 20 MG tablet    Sig: Take 1 tablet (20 mg total) by mouth daily.    Dispense:  90 tablet    Refill:  2    Disposition:  Follow up in 6 month(s)  Signed, Maximino Greenland, MD

## 2019-04-08 ENCOUNTER — Other Ambulatory Visit: Payer: Self-pay

## 2019-04-08 ENCOUNTER — Other Ambulatory Visit: Payer: Medicare Other

## 2019-04-08 DIAGNOSIS — I129 Hypertensive chronic kidney disease with stage 1 through stage 4 chronic kidney disease, or unspecified chronic kidney disease: Secondary | ICD-10-CM

## 2019-04-09 LAB — HEPATITIS C ANTIBODY: Hep C Virus Ab: <0.1 s/co ratio (ref 0.0–0.9)

## 2019-04-09 LAB — CMP14+EGFR
ALT: 13 IU/L (ref 0–32)
AST: 18 IU/L (ref 0–40)
Albumin/Globulin Ratio: 2 (ref 1.2–2.2)
Albumin: 4.3 g/dL (ref 3.7–4.7)
Alkaline Phosphatase: 49 IU/L (ref 39–117)
BUN/Creatinine Ratio: 17 (ref 12–28)
BUN: 15 mg/dL (ref 8–27)
Bilirubin Total: 0.5 mg/dL (ref 0.0–1.2)
CO2: 23 mmol/L (ref 20–29)
Calcium: 9.3 mg/dL (ref 8.7–10.3)
Chloride: 105 mmol/L (ref 96–106)
Creatinine, Ser: 0.86 mg/dL (ref 0.57–1.00)
GFR calc Af Amer: 78 mL/min/{1.73_m2} (ref >59)
GFR calc non Af Amer: 68 mL/min/{1.73_m2} (ref >59)
Globulin, Total: 2.2 g/dL (ref 1.5–4.5)
Glucose: 79 mg/dL (ref 65–99)
Potassium: 3.8 mmol/L (ref 3.5–5.2)
Sodium: 143 mmol/L (ref 134–144)
Total Protein: 6.5 g/dL (ref 6.0–8.5)

## 2019-04-09 LAB — LIPID PANEL
Chol/HDL Ratio: 2.6 ratio (ref 0.0–4.4)
Cholesterol, Total: 194 mg/dL (ref 100–199)
HDL: 75 mg/dL (ref >39)
LDL Calculated: 105 mg/dL — ABNORMAL HIGH (ref 0–99)
Triglycerides: 70 mg/dL (ref 0–149)
VLDL Cholesterol Cal: 14 mg/dL (ref 5–40)

## 2019-04-15 ENCOUNTER — Other Ambulatory Visit: Payer: Self-pay | Admitting: Internal Medicine

## 2019-04-15 DIAGNOSIS — Z1231 Encounter for screening mammogram for malignant neoplasm of breast: Secondary | ICD-10-CM

## 2019-05-21 DIAGNOSIS — T783XXA Angioneurotic edema, initial encounter: Secondary | ICD-10-CM

## 2019-05-21 HISTORY — DX: Angioneurotic edema, initial encounter: T78.3XXA

## 2019-06-01 ENCOUNTER — Ambulatory Visit
Admission: RE | Admit: 2019-06-01 | Discharge: 2019-06-01 | Disposition: A | Discharge status: Home / Self Care | Payer: Medicare Other | Source: Ambulatory Visit | Attending: Internal Medicine | Admitting: Internal Medicine

## 2019-06-01 ENCOUNTER — Other Ambulatory Visit: Payer: Self-pay

## 2019-06-01 DIAGNOSIS — Z1231 Encounter for screening mammogram for malignant neoplasm of breast: Secondary | ICD-10-CM

## 2019-06-17 ENCOUNTER — Other Ambulatory Visit: Payer: Self-pay

## 2019-06-17 ENCOUNTER — Ambulatory Visit (INDEPENDENT_AMBULATORY_CARE_PROVIDER_SITE_OTHER): Payer: Medicare Other

## 2019-06-17 VITALS — BP 128/78 | HR 93 | Temp 98.3°F | Ht 59.2 in | Wt 136.2 lb

## 2019-06-17 DIAGNOSIS — Z0000 Encounter for general adult medical examination without abnormal findings: Secondary | ICD-10-CM

## 2019-06-17 NOTE — Progress Notes (Signed)
Subjective:   Patricia Soto is a 73 y.o. female who presents for Medicare Annual (Subsequent) preventive examination.  Review of Systems:  n/a Cardiac Risk Factors include: advanced age (>1955men, 52>65 women);hypertension     Objective:     Vitals: BP 128/78 (BP Location: Left Arm, Patient Position: Sitting, Cuff Size: Normal)   Pulse 93   Temp 98.3 F (36.8 C)   Ht 4' 11.2" (1.504 m)   Wt 136 lb 3.2 oz (61.8 kg)   SpO2 98%   BMI 27.32 kg/m   Body mass index is 27.32 kg/m.  Advanced Directives 06/17/2019 10/01/2018 10/30/2015 10/20/2015  Does Patient Have a Medical Advance Directive? Yes Yes - Yes  Type of Advance Directive Living will Living will - Living will;Healthcare Power of Attorney  Does patient want to make changes to medical advance directive? - No - Patient declined - No - Patient declined  Copy of Healthcare Power of Attorney in Chart? - - No - copy requested No - copy requested    Tobacco Social History   Tobacco Use  Smoking Status Never Smoker  Smokeless Tobacco Never Used     Counseling given: Not Answered   Clinical Intake:  Pre-visit preparation completed: Yes  Pain : 0-10 Pain Score: 2  Pain Type: Chronic pain Pain Location: Head(left side of head only) Pain Orientation: Left Pain Descriptors / Indicators: Tingling Pain Onset: More than a month ago Pain Frequency: Intermittent Pain Relieving Factors: none  Pain Relieving Factors: none  Nutritional Status: BMI 25 -29 Overweight Nutritional Risks: None Diabetes: No  How often do you need to have someone help you when you read instructions, pamphlets, or other written materials from your doctor or pharmacy?: 1 - Never What is the last grade level you completed in school?: Doctorates degree  Interpreter Needed?: No  Information entered by :: Darcel BayleyN , LPN  Past Medical History:  Diagnosis Date  . Anemia    hx teen  . Angioedema 05/21/2019  . Arthritis   . Heart murmur    hx  .  Hypertension    Past Surgical History:  Procedure Laterality Date  . COLPOSCOPY  1991  . FOOT SURGERY Bilateral    bunions93,2013 rt,94 ,2012,lft toes2008lft  . MYOMECTOMY  90  . PARATHYROIDECTOMY N/A 10/30/2015   Procedure: PARATHYROIDECTOMY;  Surgeon: Darnell Levelodd Gerkin, MD;  [facility];  Service: General;  Laterality: N/A;   Family History  Problem Relation Age of Onset  . Diabetes Father   . Congenital heart disease Father   . Hypertension Father   . Heart attack Father   . Hypertension Sister   . Hypertension Sister   . Cancer Sister   . Hypertension Mother   . Other Mother        amyloidosis   Social History   Socioeconomic History  . Marital status: Single    Spouse name: Not on file  . Number of children: Not on file  . Years of education: Not on file  . Highest education level: Not on file  Occupational History  . Occupation: retired  Engineer, productionocial Needs  . Financial resource strain: Not hard at all  . Food insecurity    Worry: Never true    Inability: Never true  . Transportation needs    Medical: No    Non-medical: No  Tobacco Use  . Smoking status: Never Smoker  . Smokeless tobacco: Never Used  Substance and Sexual Activity  . Alcohol use: Yes  Alcohol/week: 5.0 standard drinks    Types: 5 Glasses of wine per week    Comment: WINE -   . Drug use: No  . Sexual activity: Not Currently    Partners: Male    Comment: 1ST  intercourse- 26, partners - refused to answer   Lifestyle  . Physical activity    Days per week: 5 days    Minutes per session: 80 min  . Stress: Not at all  Relationships  . Social Musicianconnections    Talks on phone: Not on file    Gets together: Not on file    Attends religious service: Not on file    Active member of club or organization: Not on file    Attends meetings of clubs or organizations: Not on file    Relationship status: Not on file  Other Topics Concern  . Not on file  Social History Narrative  . Not on file     Outpatient Encounter Medications as of 06/17/2019  Medication Sig  . cholecalciferol (VITAMIN D) 1000 units tablet Take 1,000 Units by mouth daily.  . Ginger, Zingiber officinalis, (GINGER ROOT) 550 MG CAPS Take 1 capsule by mouth daily.  . magnesium 30 MG tablet Take 30 mg by mouth 2 (two) times daily.  . Omega-3 Fatty Acids (OMEGA 3 500) 500 MG CAPS   . telmisartan (MICARDIS) 20 MG tablet Take 1 tablet (20 mg total) by mouth daily.  . Turmeric 1053 MG TABS Take by mouth.  . Wheat Dextrin (BENEFIBER ON THE GO) PACK Take 1 packet by mouth 2 (two) times daily.   No facility-administered encounter medications on file as of 06/17/2019.     Activities of Daily Living In your present state of health, do you have any difficulty performing the following activities: 06/17/2019 10/01/2018  Hearing? N N  Vision? N N  Difficulty concentrating or making decisions? N N  Walking or climbing stairs? N N  Dressing or bathing? N N  Doing errands, shopping? N N  Preparing Food and eating ? N N  Using the Toilet? N N  In the past six months, have you accidently leaked urine? Malvin JohnsY Y  Comment wears panty liners sometimes, holding urine too longer hold too long  Do you have problems with loss of bowel control? N N  Managing your Medications? N N  Managing your Finances? N N  Housekeeping or managing your Housekeeping? N N  Some recent data might be hidden    Patient Care Team: Dorothyann PengSanders, Robyn, MD as PCP - General (Internal Medicine)    Assessment:   This is a routine wellness examination for Patricia Soto.  Exercise Activities and Dietary recommendations Current Exercise Habits: Home exercise routine, Type of exercise: walking, Time (Minutes): > 60, Frequency (Times/Week): 5, Weekly Exercise (Minutes/Week): 0  Goals    . Blood Pressure < 140/90 (pt-stated)    . Patient Stated     06/17/2019, wants to lose 5 more pounds       Fall Risk Fall Risk  06/17/2019 03/31/2019 10/01/2018  Falls in the past year? 0 0 0   Risk for fall due to : Medication side effect - Medication side effect  Follow up Falls evaluation completed;Education provided;Falls prevention discussed - -   Is the patient's home free of loose throw rugs in walkways, pet beds, electrical cords, etc?   yes       Grab bars in the bathroom? yes      Handrails on the stairs?  yes      Adequate lighting?   yes  Timed Get Up and Go performed: n/a  Depression Screen PHQ 2/9 Scores 06/17/2019 03/31/2019 10/01/2018  PHQ - 2 Score 0 0 0  PHQ- 9 Score 0 - 1     Cognitive Function     6CIT Screen 06/17/2019 10/01/2018  What Year? 0 points 0 points  What month? 0 points 0 points  What time? 0 points 0 points  Count back from 20 0 points 0 points  Months in reverse 0 points 0 points  Repeat phrase 0 points 0 points  Total Score 0 0    Immunization History  Administered Date(s) Administered  . Influenza, High Dose Seasonal PF 08/29/2018  . Pneumococcal Conjugate-13 06/08/2015  . Pneumococcal Polysaccharide-23 04/29/2014  . Tdap 11/20/2009  . Zoster 05/24/2014    Qualifies for Shingles Vaccine? yes  Screening Tests Health Maintenance  Topic Date Due  . INFLUENZA VACCINE  06/12/2019  . TETANUS/TDAP  11/21/2019  . COLONOSCOPY  03/04/2021  . MAMMOGRAM  05/31/2021  . DEXA SCAN  Completed  . Hepatitis C Screening  Completed  . PNA vac Low Risk Adult  Completed    Cancer Screenings: Lung: Low Dose CT Chest recommended if Age 87-80 years, 30 pack-year currently smoking OR have quit w/in 15years. Patient does not qualify. Breast:  Up to date on Mammogram? Yes   Up to date of Bone Density/Dexa? Yes Colorectal: up to date  Additional Screenings: : Hepatitis C Screening: 04/08/2019     Plan:    Patient wants to lose 5 more pounds.   I have personally reviewed and noted the following in the patient's chart:   . Medical and social history . Use of alcohol, tobacco or illicit drugs  . Current medications and supplements .  Functional ability and status . Nutritional status . Physical activity . Advanced directives . List of other physicians . Hospitalizations, surgeries, and ER visits in previous 12 months . Vitals . Screenings to include cognitive, depression, and falls . Referrals and appointments  In addition, I have reviewed and discussed with patient certain preventive protocols, quality metrics, and best practice recommendations. A written personalized care plan for preventive services as well as general preventive health recommendations were provided to patient.     Kellie Simmering, LPN  02/13/[id number]

## 2019-06-17 NOTE — Patient Instructions (Signed)
[name] , Thank you for taking time to come for your Medicare Wellness Visit. I appreciate your ongoing commitment to your health goals. Please review the following plan we discussed and let me know if I can assist you in the future.   Screening recommendations/referrals: Colonoscopy: 02/2011 Mammogram: 05/2019 Bone Density: 07/2018 Recommended yearly ophthalmology/optometry visit for glaucoma screening and checkup Recommended yearly dental visit for hygiene and checkup  Vaccinations: Influenza vaccine: 08/2018 Pneumococcal vaccine: 04/2014 Tdap vaccine: 11/2009 Shingles vaccine: 2015    Advanced directives: Please bring a copy of your POA (Power of Baring) and/or Living Will to your next appointment.    Conditions/risks identified: overweight  Next appointment: 10/14/2019 at 2:30   Preventive Care 73 Years and Older, Female Preventive care refers to lifestyle choices and visits with your health care provider that can promote health and wellness. What does preventive care include?  A yearly physical exam. This is also called an annual well check.  Dental exams once or twice a year.  Routine eye exams. Ask your health care provider how often you should have your eyes checked.  Personal lifestyle choices, including:  Daily care of your teeth and gums.  Regular physical activity.  Eating a healthy diet.  Avoiding tobacco and drug use.  Limiting alcohol use.  Practicing safe sex.  Taking low-dose aspirin every day.  Taking vitamin and mineral supplements as recommended by your health care provider. What happens during an annual well check? The services and screenings done by your health care provider during your annual well check will depend on your age, overall health, lifestyle risk factors, and family history of disease. Counseling  Your health care provider may ask you questions about your:  Alcohol use.  Tobacco use.  Drug use.  Emotional well-being.   Home and relationship well-being.  Sexual activity.  Eating habits.  History of falls.  Memory and ability to understand (cognition).  Work and work Statistician.  Reproductive health. Screening  You may have the following tests or measurements:  Height, weight, and BMI.  Blood pressure.  Lipid and cholesterol levels. These may be checked every 5 years, or more frequently if you are over 46 years old.  Skin check.  Lung cancer screening. You may have this screening every year starting at age 16 if you have a 30-pack-year history of smoking and currently smoke or have quit within the past 15 years.  Fecal occult blood test (FOBT) of the stool. You may have this test every year starting at age 70.  Flexible sigmoidoscopy or colonoscopy. You may have a sigmoidoscopy every 5 years or a colonoscopy every 10 years starting at age 73.  Hepatitis C blood test.  Hepatitis B blood test.  Sexually transmitted disease (STD) testing.  Diabetes screening. This is done by checking your blood sugar (glucose) after you have not eaten for a while (fasting). You may have this done every 1-3 years.  Bone density scan. This is done to screen for osteoporosis. You may have this done starting at age 73.  Mammogram. This may be done every 1-2 years. Talk to your health care provider about how often you should have regular mammograms. Talk with your health care provider about your test results, treatment options, and if necessary, the need for more tests. Vaccines  Your health care provider may recommend certain vaccines, such as:  Influenza vaccine. This is recommended every year.  Tetanus, diphtheria, and acellular pertussis (Tdap, Td) vaccine. You may need a Td booster  every 10 years.  Zoster vaccine. You may need this after age 18.  Pneumococcal 13-valent conjugate (PCV13) vaccine. One dose is recommended after age 67.  Pneumococcal polysaccharide (PPSV23) vaccine. One dose is  recommended after age 15. Talk to your health care provider about which screenings and vaccines you need and how often you need them. This information is not intended to replace advice given to you by your health care provider. Make sure you discuss any questions you have with your health care provider. Document Released: 11/24/2015 Document Revised: 07/17/2016 Document Reviewed: 08/29/2015 Elsevier Interactive Patient Education  2017 [place] Prevention in the Home Falls can cause injuries. They can happen to people of all ages. There are many things you can do to make your home safe and to help prevent falls. What can I do on the outside of my home?  Regularly fix the edges of walkways and driveways and fix any cracks.  Remove anything that might make you trip as you walk through a door, such as a raised step or threshold.  Trim any bushes or trees on the path to your home.  Use bright outdoor lighting.  Clear any walking paths of anything that might make someone trip, such as rocks or tools.  Regularly check to see if handrails are loose or broken. Make sure that both sides of any steps have handrails.  Any raised decks and porches should have guardrails on the edges.  Have any leaves, snow, or ice cleared regularly.  Use sand or salt on walking paths during winter.  Clean up any spills in your garage right away. This includes oil or grease spills. What can I do in the bathroom?  Use night lights.  Install grab bars by the toilet and in the tub and shower. Do not use towel bars as grab bars.  Use non-skid mats or decals in the tub or shower.  If you need to sit down in the shower, use a plastic, non-slip stool.  Keep the floor dry. Clean up any water that spills on the floor as soon as it happens.  Remove soap buildup in the tub or shower regularly.  Attach bath mats securely with double-sided non-slip rug tape.  Do not have throw rugs and other things on  the floor that can make you trip. What can I do in the bedroom?  Use night lights.  Make sure that you have a light by your bed that is easy to reach.  Do not use any sheets or blankets that are too big for your bed. They should not hang down onto the floor.  Have a firm chair that has side arms. You can use this for support while you get dressed.  Do not have throw rugs and other things on the floor that can make you trip. What can I do in the kitchen?  Clean up any spills right away.  Avoid walking on wet floors.  Keep items that you use a lot in easy-to-reach places.  If you need to reach something above you, use a strong step stool that has a grab bar.  Keep electrical cords out of the way.  Do not use floor polish or wax that makes floors slippery. If you must use wax, use non-skid floor wax.  Do not have throw rugs and other things on the floor that can make you trip. What can I do with my stairs?  Do not leave any items on the stairs.  Make  sure that there are handrails on both sides of the stairs and use them. Fix handrails that are broken or loose. Make sure that handrails are as long as the stairways.  Check any carpeting to make sure that it is firmly attached to the stairs. Fix any carpet that is loose or worn.  Avoid having throw rugs at the top or bottom of the stairs. If you do have throw rugs, attach them to the floor with carpet tape.  Make sure that you have a light switch at the top of the stairs and the bottom of the stairs. If you do not have them, ask someone to add them for you. What else can I do to help prevent falls?  Wear shoes that:  Do not have high heels.  Have rubber bottoms.  Are comfortable and fit you well.  Are closed at the toe. Do not wear sandals.  If you use a stepladder:  Make sure that it is fully opened. Do not climb a closed stepladder.  Make sure that both sides of the stepladder are locked into place.  Ask someone to  hold it for you, if possible.  Clearly mark and make sure that you can see:  Any grab bars or handrails.  First and last steps.  Where the edge of each step is.  Use tools that help you move around (mobility aids) if they are needed. These include:  Canes.  Walkers.  Scooters.  Crutches.  Turn on the lights when you go into a dark area. Replace any light bulbs as soon as they burn out.  Set up your furniture so you have a clear path. Avoid moving your furniture around.  If any of your floors are uneven, fix them.  If there are any pets around you, be aware of where they are.  Review your medicines with your doctor. Some medicines can make you feel dizzy. This can increase your chance of falling. Ask your doctor what other things that you can do to help prevent falls. This information is not intended to replace advice given to you by your health care provider. Make sure you discuss any questions you have with your health care provider. Document Released: 08/24/2009 Document Revised: 04/04/2016 Document Reviewed: 12/02/2014 Elsevier Interactive Patient Education  2017 [employer].

## 2019-06-20 ENCOUNTER — Encounter: Payer: Self-pay | Admitting: Internal Medicine

## 2019-07-01 ENCOUNTER — Other Ambulatory Visit: Payer: Self-pay

## 2019-07-02 ENCOUNTER — Encounter: Payer: Self-pay | Admitting: Obstetrics & Gynecology

## 2019-07-02 ENCOUNTER — Ambulatory Visit (INDEPENDENT_AMBULATORY_CARE_PROVIDER_SITE_OTHER): Payer: Medicare Other | Admitting: Obstetrics & Gynecology

## 2019-07-02 VITALS — BP 130/70 | Ht 60.0 in | Wt 138.0 lb

## 2019-07-02 DIAGNOSIS — M8589 Other specified disorders of bone density and structure, multiple sites: Secondary | ICD-10-CM | POA: Diagnosis not present

## 2019-07-02 DIAGNOSIS — Z01419 Encounter for gynecological examination (general) (routine) without abnormal findings: Secondary | ICD-10-CM

## 2019-07-02 DIAGNOSIS — Z780 Asymptomatic menopausal state: Secondary | ICD-10-CM | POA: Diagnosis not present

## 2019-07-02 NOTE — Progress Notes (Signed)
Patricia Soto 02/04/1946 [id number]   History:    73 y.o. G0 single  RP:  Established patient presenting for annual gyn exam   HPI: Menopause, well on no HRT.  No postmenopausal bleeding.  No pelvic pain.  Abstinent.  Urine and bowel movements normal.  Breasts normal.  Patient is very active, walking regularly, every day and doing small weights.  Body mass index 26.95.  Health labs with family physician.  Last colonoscopy in 2012.  Past medical history,surgical history, family history and social history were all reviewed and documented in the EPIC chart.  Gynecologic History No LMP recorded. Patient is postmenopausal. Contraception: post menopausal status Last Pap: 2017. Results were: Negative Last mammogram: July 2020. Results were: Negative Bone Density: September 2019 osteopenia T score -2.0 at the spine Colonoscopy: 8 years ago  Obstetric History OB History  Gravida Para Term Preterm AB Living  0 0 0 0 0 0  SAB TAB Ectopic Multiple Live Births  0 0 0 0 0     ROS: A ROS was performed and pertinent positives and negatives are included in the history.  GENERAL: No fevers or chills. HEENT: No change in vision, no earache, sore throat or sinus congestion. NECK: No pain or stiffness. CARDIOVASCULAR: No chest pain or pressure. No palpitations. PULMONARY: No shortness of breath, cough or wheeze. GASTROINTESTINAL: No abdominal pain, nausea, vomiting or diarrhea, melena or bright red blood per rectum. GENITOURINARY: No urinary frequency, urgency, hesitancy or dysuria. MUSCULOSKELETAL: No joint or muscle pain, no back pain, no recent trauma. DERMATOLOGIC: No rash, no itching, no lesions. ENDOCRINE: No polyuria, polydipsia, no heat or cold intolerance. No recent change in weight. HEMATOLOGICAL: No anemia or easy bruising or bleeding. NEUROLOGIC: No headache, seizures, numbness, tingling or weakness. PSYCHIATRIC: No depression, no loss of interest in normal activity or change in sleep  pattern.     Exam:   BP 130/70    Ht 5' (1.524 m)    Wt 138 lb (62.6 kg)    BMI 26.95 kg/m   Body mass index is 26.95 kg/m.  General appearance : Well developed well nourished female. No acute distress HEENT: Eyes: no retinal hemorrhage or exudates,  Neck supple, trachea midline, no carotid bruits, no thyroidmegaly Lungs: Clear to auscultation, no rhonchi or wheezes, or rib retractions  Heart: Regular rate and rhythm, no murmurs or gallops Breast:Examined in sitting and supine position were symmetrical in appearance, no palpable masses or tenderness,  no skin retraction, no nipple inversion, no nipple discharge, no skin discoloration, no axillary or supraclavicular lymphadenopathy Abdomen: no palpable masses or tenderness, no rebound or guarding Extremities: no edema or skin discoloration or tenderness  Pelvic: Vulva: Normal             Vagina: No gross lesions or discharge  Cervix: No gross lesions or discharge  Uterus  AV, normal size, shape and consistency, non-tender and mobile  Adnexa  Without masses or tenderness  Anus: Normal   Assessment/Plan:  73 y.o. female for annual exam   1. Well female exam with routine gynecological exam Normal gynecologic exam in menopause.  Pap test negative in 2017, no indication to repeat this year.  Breast exam normal.  Screening mammogram July 2020 was negative.  Colonoscopy 8 years ago.  Fasting health labs with family physician.  Body mass index 26.95.  Continue with fitness and healthy nutrition.  2. Postmenopause Well on no hormone replacement therapy.  No postmenopausal bleeding.  3. Osteopenia of  multiple sites Osteopenia with lowest T score at the spine at -2.0.  Recommend vitamin D supplements, calcium intake of 1200 mg daily and regular weightbearing physical activities to continue.  Patricia DelMarie-Lyne Perlie Scheuring MD, 2:22 PM 07/02/2019

## 2019-07-10 ENCOUNTER — Encounter: Payer: Self-pay | Admitting: Obstetrics & Gynecology

## 2019-07-10 NOTE — Patient Instructions (Signed)
1. Well female exam with routine gynecological exam Normal gynecologic exam in menopause.  Pap test negative in 2017, no indication to repeat this year.  Breast exam normal.  Screening mammogram July 2020 was negative.  Colonoscopy 8 years ago.  Fasting health labs with family physician.  Body mass index 26.95.  Continue with fitness and healthy nutrition.  2. Postmenopause Well on no hormone replacement therapy.  No postmenopausal bleeding.  3. Osteopenia of multiple sites Osteopenia with lowest T score at the spine at -2.0.  Recommend vitamin D supplements, calcium intake of 1200 mg daily and regular weightbearing physical activities to continue.  Patricia Soto, it was a pleasure seeing you today!

## 2019-08-05 LAB — HM DIABETES EYE EXAM

## 2019-08-31 ENCOUNTER — Encounter: Payer: Self-pay | Admitting: Internal Medicine

## 2019-09-02 ENCOUNTER — Encounter: Payer: Self-pay | Admitting: Internal Medicine

## 2019-10-13 ENCOUNTER — Encounter: Payer: Self-pay | Admitting: Internal Medicine

## 2019-10-14 ENCOUNTER — Ambulatory Visit (INDEPENDENT_AMBULATORY_CARE_PROVIDER_SITE_OTHER): Payer: Medicare Other | Admitting: Internal Medicine

## 2019-10-14 ENCOUNTER — Other Ambulatory Visit: Payer: Self-pay

## 2019-10-14 ENCOUNTER — Ambulatory Visit: Payer: Medicare Other

## 2019-10-14 ENCOUNTER — Encounter: Payer: Self-pay | Admitting: Internal Medicine

## 2019-10-14 ENCOUNTER — Ambulatory Visit: Payer: Medicare Other | Admitting: Internal Medicine

## 2019-10-14 VITALS — BP 124/82 | HR 88 | Temp 98.1°F | Ht 60.0 in | Wt 136.6 lb

## 2019-10-14 DIAGNOSIS — Z0000 Encounter for general adult medical examination without abnormal findings: Secondary | ICD-10-CM

## 2019-10-14 DIAGNOSIS — I129 Hypertensive chronic kidney disease with stage 1 through stage 4 chronic kidney disease, or unspecified chronic kidney disease: Secondary | ICD-10-CM | POA: Diagnosis not present

## 2019-10-14 DIAGNOSIS — N182 Chronic kidney disease, stage 2 (mild): Secondary | ICD-10-CM

## 2019-10-14 LAB — POCT UA - MICROALBUMIN
Albumin/Creatinine Ratio, Urine, POC: 30
Creatinine, POC: 50 mg/dL
Microalbumin Ur, POC: 10 mg/L

## 2019-10-14 LAB — POCT URINALYSIS DIPSTICK
Bilirubin, UA: NEGATIVE
Glucose, UA: NEGATIVE
Ketones, UA: NEGATIVE
Leukocytes, UA: NEGATIVE
Nitrite, UA: NEGATIVE
Protein, UA: NEGATIVE
Spec Grav, UA: 1.01 (ref 1.010–1.025)
Urobilinogen, UA: 0.2 E.U./dL
pH, UA: 7 (ref 5.0–8.0)

## 2019-10-14 NOTE — Progress Notes (Signed)
This visit occurred during the SARS-CoV-2 public health emergency.  Safety protocols were in place, including screening questions prior to the visit, additional usage of staff PPE, and extensive cleaning of exam room while observing appropriate contact time as indicated for disinfecting solutions.  Subjective:     Patient ID: Patricia Soto , female    DOB: 05-Feb-1946 , 73 y.o.   MRN: [id number]   Chief Complaint  Patient presents with  . Annual Exam  . Hypertension    HPI  She is here today for a full physical examination. She is followed by GYN for pelvic exams. She has no specific concerns or complaints at this time.   Hypertension This is a chronic problem. The current episode started more than 1 year ago. The problem has been gradually improving since onset. The problem is controlled. Pertinent negatives include no blurred vision, chest pain, palpitations or shortness of breath. Risk factors for coronary artery disease include post-menopausal state. The current treatment provides moderate improvement. There are no compliance problems.  Hypertensive end-organ damage includes kidney disease.     Past Medical History:  Diagnosis Date  . Anemia    hx teen  . Angioedema 05/21/2019  . Arthritis   . Heart murmur    hx  . Hypertension      Family History  Problem Relation Age of Onset  . Diabetes Father   . Congenital heart disease Father   . Hypertension Father   . Heart attack Father   . Hypertension Sister   . Hypertension Sister   . Cancer Sister   . Hypertension Mother   . Other Mother        amyloidosis     Current Outpatient Medications:  .  cholecalciferol (VITAMIN D) 1000 units tablet, Take 1,000 Units by mouth daily., Disp: , Rfl:  .  Ginger, Zingiber officinalis, (GINGER ROOT) 550 MG CAPS, Take 1 capsule by mouth daily., Disp: , Rfl:  .  magnesium 30 MG tablet, Take 30 mg by mouth 2 (two) times daily., Disp: , Rfl:  .  Omega-3 Fatty Acids (OMEGA 3 500) 500  MG CAPS, , Disp: , Rfl:  .  telmisartan (MICARDIS) 20 MG tablet, Take 1 tablet (20 mg total) by mouth daily., Disp: 90 tablet, Rfl: 2 .  Turmeric 1053 MG TABS, Take by mouth., Disp: , Rfl:  .  Wheat Dextrin (BENEFIBER ON THE GO) PACK, Take 1 packet by mouth 2 (two) times daily., Disp: , Rfl:    No Known Allergies    The patient states she uses post menopausal status for birth control. Last LMP was No LMP recorded. Patient is postmenopausal.. Negative for Dysmenorrhea  Negative for: breast discharge, breast lump(s), breast pain and breast self exam. Associated symptoms include abnormal vaginal bleeding. Pertinent negatives include abnormal bleeding (hematology), anxiety, decreased libido, depression, difficulty falling sleep, dyspareunia, history of infertility, nocturia, sexual dysfunction, sleep disturbances, urinary incontinence, urinary urgency, vaginal discharge and vaginal itching. Diet regular.The patient states her exercise level is  moderately vigorous.   . The patient's tobacco use is:  Social History   Tobacco Use  Smoking Status Never Smoker  Smokeless Tobacco Never Used  . She has been exposed to passive smoke. The patient's alcohol use is:  Social History   Substance and Sexual Activity  Alcohol Use Yes  . Alcohol/week: 5.0 standard drinks  . Types: 5 Glasses of wine per week   Comment: WINE -     Review of Systems  Constitutional:  Negative.   HENT: Negative.   Eyes: Negative.  Negative for blurred vision.  Respiratory: Negative.  Negative for shortness of breath.   Cardiovascular: Negative.  Negative for chest pain and palpitations.  Endocrine: Negative.   Genitourinary: Negative.   Musculoskeletal: Negative.   Skin: Negative.   Allergic/Immunologic: Negative.   Neurological: Negative.   Hematological: Negative.   Psychiatric/Behavioral: Negative.      Today's Vitals   10/14/19 1433  BP: 124/82  Pulse: 88  Temp: 98.1 F (36.7 C)  TempSrc: Oral  Weight:  136 lb 9.6 oz (62 kg)  Height: 5' (1.524 m)   Body mass index is 26.68 kg/m.   Objective:  Physical Exam Vitals signs and nursing note reviewed.  Constitutional:      Appearance: Normal appearance.  HENT:     Head: Normocephalic and atraumatic.     Right Ear: Tympanic membrane, ear canal and external ear normal.     Left Ear: Tympanic membrane, ear canal and external ear normal.     Nose: Nose normal.     Mouth/Throat:     Mouth: Mucous membranes are moist.     Pharynx: Oropharynx is clear.  Eyes:     Extraocular Movements: Extraocular movements intact.     Conjunctiva/sclera: Conjunctivae normal.     Pupils: Pupils are equal, round, and reactive to light.  Neck:     Musculoskeletal: Normal range of motion and neck supple.  Cardiovascular:     Rate and Rhythm: Normal rate and regular rhythm.     Pulses: Normal pulses.     Heart sounds: Normal heart sounds.  Pulmonary:     Effort: Pulmonary effort is normal.     Breath sounds: Normal breath sounds.  Chest:     Breasts: Tanner Score is 5.        Right: Normal.        Left: Normal.  Abdominal:     General: Abdomen is flat. Bowel sounds are normal.     Palpations: Abdomen is soft.  Genitourinary:    Comments: deferred Musculoskeletal: Normal range of motion.  Skin:    General: Skin is warm and dry.  Neurological:     General: No focal deficit present.     Mental Status: She is alert and oriented to person, place, and time.  Psychiatric:        Mood and Affect: Mood normal.        Behavior: Behavior normal.         Assessment And Plan:     1. Routine general medical examination at health care facility  A full exam was performed.  Importance of monthly self breast exams was discussed with the patient. PATIENT HAS BEEN ADVISED TO GET 30-45 MINUTES REGULAR EXERCISE NO LESS THAN FOUR TO FIVE DAYS PER WEEK - BOTH WEIGHTBEARING EXERCISES AND AEROBIC ARE RECOMMENDED.  SHE WAS ADVISED TO FOLLOW A HEALTHY DIET WITH AT  LEAST SIX FRUITS/VEGGIES PER DAY, DECREASE INTAKE OF RED MEAT, AND TO INCREASE FISH INTAKE TO TWO DAYS PER WEEK.  MEATS/FISH SHOULD NOT BE FRIED, BAKED OR BROILED IS PREFERABLE.  I SUGGEST WEARING SPF 50 SUNSCREEN ON EXPOSED PARTS AND ESPECIALLY WHEN IN THE DIRECT SUNLIGHT FOR AN EXTENDED PERIOD OF TIME.  PLEASE AVOID FAST FOOD RESTAURANTS AND INCREASE YOUR WATER INTAKE.   2. Hypertensive nephropathy  Chronic, well controlled. She will continue with current meds. She is encouraged to avoid adding salt to her foods. EKG performed, no new changes. She will  rto in six months for re-evaluation.   - EKG 12-Lead - POCT Urinalysis Dipstick (81002) - POCT UA - Microalbumin - CMP14+EGFR - CBC - Lipid panel  3. Chronic renal disease, stage II  Chronic, yet stable. I will check GFR. Cr today. Importance of maintaining optimal bp control and stay well hydrated was discussed with the patient.  Maximino Greenland, MD    THE PATIENT IS ENCOURAGED TO PRACTICE SOCIAL DISTANCING DUE TO THE COVID-19 PANDEMIC.

## 2019-10-14 NOTE — Patient Instructions (Signed)
Health Maintenance, Female Adopting a healthy lifestyle and getting preventive care are important in promoting health and wellness. Ask your health care provider about:  The right schedule for you to have regular tests and exams.  Things you can do on your own to prevent diseases and keep yourself healthy. What should I know about diet, weight, and exercise? Eat a healthy diet   Eat a diet that includes plenty of vegetables, fruits, low-fat dairy products, and lean protein.  Do not eat a lot of foods that are high in solid fats, added sugars, or sodium. Maintain a healthy weight Body mass index (BMI) is used to identify weight problems. It estimates body fat based on height and weight. Your health care provider can help determine your BMI and help you achieve or maintain a healthy weight. Get regular exercise Get regular exercise. This is one of the most important things you can do for your health. Most adults should:  Exercise for at least 150 minutes each week. The exercise should increase your heart rate and make you sweat (moderate-intensity exercise).  Do strengthening exercises at least twice a week. This is in addition to the moderate-intensity exercise.  Spend less time sitting. Even light physical activity can be beneficial. Watch cholesterol and blood lipids Have your blood tested for lipids and cholesterol at 73 years of age, then have this test every 5 years. Have your cholesterol levels checked more often if:  Your lipid or cholesterol levels are high.  You are older than 73 years of age.  You are at high risk for heart disease. What should I know about cancer screening? Depending on your health history and family history, you may need to have cancer screening at various ages. This may include screening for:  Breast cancer.  Cervical cancer.  Colorectal cancer.  Skin cancer.  Lung cancer. What should I know about heart disease, diabetes, and high blood  pressure? Blood pressure and heart disease  High blood pressure causes heart disease and increases the risk of stroke. This is more likely to develop in people who have high blood pressure readings, are of African descent, or are overweight.  Have your blood pressure checked: ? Every 3-5 years if you are 18-39 years of age. ? Every year if you are 40 years old or older. Diabetes Have regular diabetes screenings. This checks your fasting blood sugar level. Have the screening done:  Once every three years after age 40 if you are at a normal weight and have a low risk for diabetes.  More often and at a younger age if you are overweight or have a high risk for diabetes. What should I know about preventing infection? Hepatitis B If you have a higher risk for hepatitis B, you should be screened for this virus. Talk with your health care provider to find out if you are at risk for hepatitis B infection. Hepatitis C Testing is recommended for:  Everyone born from 1945 through 1965.  Anyone with known risk factors for hepatitis C. Sexually transmitted infections (STIs)  Get screened for STIs, including gonorrhea and chlamydia, if: ? You are sexually active and are younger than 73 years of age. ? You are older than 73 years of age and your health care provider tells you that you are at risk for this type of infection. ? Your sexual activity has changed since you were last screened, and you are at increased risk for chlamydia or gonorrhea. Ask your health care provider if   you are at risk.  Ask your health care provider about whether you are at high risk for HIV. Your health care provider may recommend a prescription medicine to help prevent HIV infection. If you choose to take medicine to prevent HIV, you should first get tested for HIV. You should then be tested every 3 months for as long as you are taking the medicine. Pregnancy  If you are about to stop having your period (premenopausal) and  you may become pregnant, seek counseling before you get pregnant.  Take 400 to 800 micrograms (mcg) of folic acid every day if you become pregnant.  Ask for birth control (contraception) if you want to prevent pregnancy. Osteoporosis and menopause Osteoporosis is a disease in which the bones lose minerals and strength with aging. This can result in bone fractures. If you are 65 years old or older, or if you are at risk for osteoporosis and fractures, ask your health care provider if you should:  Be screened for bone loss.  Take a calcium or vitamin D supplement to lower your risk of fractures.  Be given hormone replacement therapy (HRT) to treat symptoms of menopause. Follow these instructions at home: Lifestyle  Do not use any products that contain nicotine or tobacco, such as cigarettes, e-cigarettes, and chewing tobacco. If you need help quitting, ask your health care provider.  Do not use street drugs.  Do not share needles.  Ask your health care provider for help if you need support or information about quitting drugs. Alcohol use  Do not drink alcohol if: ? Your health care provider tells you not to drink. ? You are pregnant, may be pregnant, or are planning to become pregnant.  If you drink alcohol: ? Limit how much you use to 0-1 drink a day. ? Limit intake if you are breastfeeding.  Be aware of how much alcohol is in your drink. In the [place] one drink equals one 12 oz bottle of beer (355 mL), one 5 oz glass of wine (148 mL), or one 1 oz glass of hard liquor (44 mL). General instructions  Schedule regular health, dental, and eye exams.  Stay current with your vaccines.  Tell your health care provider if: ? You often feel depressed. ? You have ever been abused or do not feel safe at home. Summary  Adopting a healthy lifestyle and getting preventive care are important in promoting health and wellness.  Follow your health care provider's instructions about healthy  diet, exercising, and getting tested or screened for diseases.  Follow your health care provider's instructions on monitoring your cholesterol and blood pressure. This information is not intended to replace advice given to you by your health care provider. Make sure you discuss any questions you have with your health care provider. Document Released: 05/13/2011 Document Revised: 10/21/2018 Document Reviewed: 10/21/2018 Elsevier Patient Education  2020 [employer] Inc.  

## 2019-10-15 LAB — CMP14+EGFR
ALT: 12 IU/L (ref 0–32)
AST: 19 IU/L (ref 0–40)
Albumin/Globulin Ratio: 1.5 (ref 1.2–2.2)
Albumin: 4.2 g/dL (ref 3.7–4.7)
Alkaline Phosphatase: 54 IU/L (ref 39–117)
BUN/Creatinine Ratio: 16 (ref 12–28)
BUN: 13 mg/dL (ref 8–27)
Bilirubin Total: 0.7 mg/dL (ref 0.0–1.2)
CO2: 19 mmol/L — ABNORMAL LOW (ref 20–29)
Calcium: 9.5 mg/dL (ref 8.7–10.3)
Chloride: 103 mmol/L (ref 96–106)
Creatinine, Ser: 0.82 mg/dL (ref 0.57–1.00)
GFR calc Af Amer: 83 mL/min/{1.73_m2} (ref >59)
GFR calc non Af Amer: 72 mL/min/{1.73_m2} (ref >59)
Globulin, Total: 2.8 g/dL (ref 1.5–4.5)
Glucose: 152 mg/dL — ABNORMAL HIGH (ref 65–99)
Potassium: 3.4 mmol/L — ABNORMAL LOW (ref 3.5–5.2)
Sodium: 142 mmol/L (ref 134–144)
Total Protein: 7 g/dL (ref 6.0–8.5)

## 2019-10-15 LAB — LIPID PANEL
Chol/HDL Ratio: 3 ratio (ref 0.0–4.4)
Cholesterol, Total: 219 mg/dL — ABNORMAL HIGH (ref 100–199)
HDL: 72 mg/dL (ref >39)
LDL Chol Calc (NIH): 135 mg/dL — ABNORMAL HIGH (ref 0–99)
Triglycerides: 69 mg/dL (ref 0–149)
VLDL Cholesterol Cal: 12 mg/dL (ref 5–40)

## 2019-10-15 LAB — CBC
Hematocrit: 32.2 % — ABNORMAL LOW (ref 34.0–46.6)
Hemoglobin: 11 g/dL — ABNORMAL LOW (ref 11.1–15.9)
MCH: 32.4 pg (ref 26.6–33.0)
MCHC: 34.2 g/dL (ref 31.5–35.7)
MCV: 95 fL (ref 79–97)
Platelets: 158 10*3/uL (ref 150–450)
RBC: 3.4 x10E6/uL — ABNORMAL LOW (ref 3.77–5.28)
RDW: 12.2 % (ref 11.7–15.4)
WBC: 4.2 10*3/uL (ref 3.4–10.8)

## 2019-10-20 ENCOUNTER — Encounter: Payer: Self-pay | Admitting: Internal Medicine

## 2019-10-28 ENCOUNTER — Other Ambulatory Visit: Payer: Medicare Other

## 2019-10-28 ENCOUNTER — Other Ambulatory Visit (INDEPENDENT_AMBULATORY_CARE_PROVIDER_SITE_OTHER): Payer: Medicare Other

## 2019-10-28 ENCOUNTER — Other Ambulatory Visit: Payer: Self-pay

## 2019-10-28 DIAGNOSIS — R8290 Unspecified abnormal findings in urine: Secondary | ICD-10-CM | POA: Diagnosis not present

## 2019-10-28 LAB — POCT URINALYSIS DIPSTICK
Bilirubin, UA: NEGATIVE
Glucose, UA: NEGATIVE
Ketones, UA: NEGATIVE
Nitrite, UA: NEGATIVE
Protein, UA: NEGATIVE
Spec Grav, UA: <=1.005 — AB (ref 1.010–1.025)
Urobilinogen, UA: 0.2 E.U./dL
pH, UA: 5.5 (ref 5.0–8.0)

## 2019-10-29 ENCOUNTER — Encounter: Payer: Self-pay | Admitting: Internal Medicine

## 2019-11-09 ENCOUNTER — Encounter: Payer: Self-pay | Admitting: Internal Medicine

## 2019-11-09 ENCOUNTER — Other Ambulatory Visit: Payer: Self-pay

## 2019-11-09 ENCOUNTER — Other Ambulatory Visit: Payer: Medicare Other

## 2019-11-09 ENCOUNTER — Other Ambulatory Visit (INDEPENDENT_AMBULATORY_CARE_PROVIDER_SITE_OTHER): Payer: Medicare Other

## 2019-11-09 DIAGNOSIS — R319 Hematuria, unspecified: Secondary | ICD-10-CM

## 2019-11-09 DIAGNOSIS — R8290 Unspecified abnormal findings in urine: Secondary | ICD-10-CM

## 2019-11-09 LAB — POCT URINALYSIS DIPSTICK
Bilirubin, UA: NEGATIVE
Glucose, UA: NEGATIVE
Nitrite, UA: NEGATIVE
Protein, UA: NEGATIVE
Spec Grav, UA: 1.025 (ref 1.010–1.025)
Urobilinogen, UA: 0.2 E.U./dL
pH, UA: 5.5 (ref 5.0–8.0)

## 2019-11-09 NOTE — Progress Notes (Signed)
OC

## 2019-11-10 ENCOUNTER — Other Ambulatory Visit: Payer: Self-pay | Admitting: Internal Medicine

## 2019-11-10 DIAGNOSIS — R319 Hematuria, unspecified: Secondary | ICD-10-CM

## 2019-11-11 LAB — URINE CULTURE

## 2019-11-18 ENCOUNTER — Ambulatory Visit
Admission: RE | Admit: 2019-11-18 | Discharge: 2019-11-18 | Disposition: A | Discharge status: Home / Self Care | Payer: Medicare PPO | Source: Ambulatory Visit | Attending: Internal Medicine | Admitting: Internal Medicine

## 2019-11-18 DIAGNOSIS — N1330 Unspecified hydronephrosis: Secondary | ICD-10-CM | POA: Diagnosis not present

## 2019-11-18 DIAGNOSIS — R319 Hematuria, unspecified: Secondary | ICD-10-CM

## 2019-11-20 ENCOUNTER — Encounter: Payer: Self-pay | Admitting: Internal Medicine

## 2019-11-22 ENCOUNTER — Other Ambulatory Visit: Payer: Self-pay

## 2019-11-22 DIAGNOSIS — R3129 Other microscopic hematuria: Secondary | ICD-10-CM

## 2019-11-24 ENCOUNTER — Encounter: Payer: Self-pay | Admitting: Internal Medicine

## 2019-11-26 ENCOUNTER — Encounter: Payer: Self-pay | Admitting: Internal Medicine

## 2019-12-01 ENCOUNTER — Encounter: Payer: Self-pay | Admitting: Internal Medicine

## 2019-12-01 ENCOUNTER — Other Ambulatory Visit: Payer: Self-pay

## 2019-12-01 ENCOUNTER — Ambulatory Visit: Payer: Medicare PPO | Admitting: Internal Medicine

## 2019-12-01 VITALS — BP 138/76 | HR 93 | Temp 98.1°F | Ht 61.0 in | Wt 138.4 lb

## 2019-12-01 DIAGNOSIS — E049 Nontoxic goiter, unspecified: Secondary | ICD-10-CM | POA: Diagnosis not present

## 2019-12-01 DIAGNOSIS — Z23 Encounter for immunization: Secondary | ICD-10-CM

## 2019-12-01 MED ORDER — TETANUS-DIPHTH-ACELL PERTUSSIS 5-2.5-18.5 LF-MCG/0.5 IM SUSP: 0.5000 mL | Freq: Once | INTRAMUSCULAR | 0 refills | Status: AC

## 2019-12-01 NOTE — Patient Instructions (Signed)
COVID-19 Vaccine Information can be found at: [email or web address] For questions related to vaccine distribution or appointments, please email [email or web address] or call [phone].    

## 2019-12-02 LAB — TSH: TSH: 1.8 u[IU]/mL (ref 0.450–4.500)

## 2019-12-04 NOTE — Progress Notes (Signed)
This visit occurred during the SARS-CoV-2 public health emergency.  Safety protocols were in place, including screening questions prior to the visit, additional usage of staff PPE, and extensive cleaning of exam room while observing appropriate contact time as indicated for disinfecting solutions.  Subjective:     Patient ID: Patricia Soto , female    DOB: 1945/12/21 , 74 y.o.   MRN: [id number]   Chief Complaint  Patient presents with  . Parathyroid f/u  . Immunizations    Tdap    HPI  She is here today for evaluation of her parathyroid. She is s/p parathyroidectomy in 2016. She is concerned that elevated calcium level seen on last bloodwork is due to her parathyroid. She has also noticed some swelling in her neck area and feels that something may be "stuck" there. She denies odynophagia or dysphagia.     Past Medical History:  Diagnosis Date  . Anemia    hx teen  . Angioedema 05/21/2019  . Arthritis   . Heart murmur    hx  . Hypertension      Family History  Problem Relation Age of Onset  . Diabetes Father   . Congenital heart disease Father   . Hypertension Father   . Heart attack Father   . Hypertension Sister   . Hypertension Sister   . Cancer Sister   . Hypertension Mother   . Other Mother        amyloidosis     Current Outpatient Medications:  .  Ginger, Zingiber officinalis, (GINGER ROOT) 550 MG CAPS, Take 1 capsule by mouth daily., Disp: , Rfl:  .  Magnesium 500 MG CAPS, , Disp: , Rfl:  .  Omega-3 1000 MG CAPS, , Disp: , Rfl:  .  telmisartan (MICARDIS) 20 MG tablet, Take 1 tablet (20 mg total) by mouth daily., Disp: 90 tablet, Rfl: 2 .  Wheat Dextrin (BENEFIBER ON THE GO) PACK, Take 1 packet by mouth 2 (two) times daily., Disp: , Rfl:    No Known Allergies   Review of Systems  Constitutional: Negative.   Respiratory: Negative.   Cardiovascular: Negative.   Gastrointestinal: Negative.   Neurological: Negative.   Psychiatric/Behavioral: Negative.       Today's Vitals   12/01/19 1107  BP: 138/76  Pulse: 93  Temp: 98.1 F (36.7 C)  TempSrc: Oral  Weight: 138 lb 6.4 oz (62.8 kg)  Height: 5\' 1"  (1.549 m)  PainSc: 0-No pain   Body mass index is 26.15 kg/m.   Objective:  Physical Exam Vitals and nursing note reviewed.  Constitutional:      Appearance: Normal appearance.  HENT:     Head: Normocephalic and atraumatic.  Neck:     Thyroid: Thyromegaly present. No thyroid tenderness.  Cardiovascular:     Rate and Rhythm: Normal rate and regular rhythm.     Heart sounds: Normal heart sounds.  Pulmonary:     Effort: Pulmonary effort is normal.     Breath sounds: Normal breath sounds.  Musculoskeletal:     Cervical back: Normal range of motion.  Skin:    General: Skin is warm.  Neurological:     General: No focal deficit present.     Mental Status: She is alert.  Psychiatric:        Mood and Affect: Mood normal.        Behavior: Behavior normal.         Assessment And Plan:     1. Goiter  I  will check TSH today and refer her for thyroid ultrasound. Pt advised that her sx are not due to her parathyroid gland. Most recent calcium level is within normal limits. I will make further recommendations once her labs are available for review.    - TSH - US Soft Tissue Head/Neck; Future  2. Immunization due  Boostrix (Tdap) was sent to her local pharmacy. She is encouraged to let me know when she gets this done. Advised to wait at least two weeks after administration to get COVID vaccine.   Patricia Greenland, MD    THE PATIENT IS ENCOURAGED TO PRACTICE SOCIAL DISTANCING DUE TO THE COVID-19 PANDEMIC.

## 2019-12-16 ENCOUNTER — Ambulatory Visit
Admission: RE | Admit: 2019-12-16 | Discharge: 2019-12-16 | Disposition: A | Discharge status: Home / Self Care | Payer: Medicare PPO | Source: Ambulatory Visit | Attending: Internal Medicine | Admitting: Internal Medicine

## 2019-12-16 DIAGNOSIS — E049 Nontoxic goiter, unspecified: Secondary | ICD-10-CM

## 2019-12-16 DIAGNOSIS — E042 Nontoxic multinodular goiter: Secondary | ICD-10-CM | POA: Diagnosis not present

## 2019-12-17 DIAGNOSIS — R351 Nocturia: Secondary | ICD-10-CM | POA: Diagnosis not present

## 2019-12-17 DIAGNOSIS — R350 Frequency of micturition: Secondary | ICD-10-CM | POA: Diagnosis not present

## 2019-12-17 DIAGNOSIS — R3121 Asymptomatic microscopic hematuria: Secondary | ICD-10-CM | POA: Diagnosis not present

## 2019-12-23 ENCOUNTER — Other Ambulatory Visit: Payer: Self-pay | Admitting: Internal Medicine

## 2019-12-30 ENCOUNTER — Ambulatory Visit: Payer: Medicare PPO

## 2020-01-03 ENCOUNTER — Ambulatory Visit: Payer: Medicare PPO | Attending: Family

## 2020-01-03 DIAGNOSIS — R3121 Asymptomatic microscopic hematuria: Secondary | ICD-10-CM | POA: Diagnosis not present

## 2020-01-03 DIAGNOSIS — Z23 Encounter for immunization: Secondary | ICD-10-CM | POA: Insufficient documentation

## 2020-01-03 NOTE — Progress Notes (Signed)
   Covid-19 Vaccination Clinic  Name:  Patricia Soto    MRN: [id number] DOB: 1946/10/10  01/03/2020  [name] was observed post Covid-19 immunization for 15 minutes without incidence. She was provided with Vaccine Information Sheet and instruction to access the V-Safe system.   [name] was instructed to call 911 with any severe reactions post vaccine: Marland Kitchen Difficulty breathing  . Swelling of your face and throat  . A fast heartbeat  . A bad rash all over your body  . Dizziness and weakness    Immunizations Administered    Name Date Dose VIS Date Route   Moderna COVID-19 Vaccine 01/03/2020 11:54 AM 0.5 mL 10/12/2019 Intramuscular   Manufacturer: Moderna   Lot: [id number]   NDC: 92119-417-40

## 2020-01-07 DIAGNOSIS — R3121 Asymptomatic microscopic hematuria: Secondary | ICD-10-CM | POA: Diagnosis not present

## 2020-01-07 DIAGNOSIS — R350 Frequency of micturition: Secondary | ICD-10-CM | POA: Diagnosis not present

## 2020-02-01 ENCOUNTER — Ambulatory Visit: Payer: Medicare PPO | Attending: Family

## 2020-02-01 DIAGNOSIS — Z23 Encounter for immunization: Secondary | ICD-10-CM

## 2020-02-01 NOTE — Progress Notes (Signed)
   Covid-19 Vaccination Clinic  Name:  Patricia Soto    MRN: [id number] DOB: 1945-12-19  02/01/2020  [name] was observed post Covid-19 immunization for 15 minutes without incident. She was provided with Vaccine Information Sheet and instruction to access the V-Safe system.   [name] was instructed to call 911 with any severe reactions post vaccine: Marland Kitchen Difficulty breathing  . Swelling of face and throat  . A fast heartbeat  . A bad rash all over body  . Dizziness and weakness   Immunizations Administered    Name Date Dose VIS Date Route   Moderna COVID-19 Vaccine 02/01/2020  3:08 PM 0.5 mL 10/12/2019 Intramuscular   Manufacturer: Moderna   Lot: [id number]   NDC: 37005-259-10

## 2020-04-13 ENCOUNTER — Ambulatory Visit (INDEPENDENT_AMBULATORY_CARE_PROVIDER_SITE_OTHER): Payer: Medicare PPO

## 2020-04-13 ENCOUNTER — Other Ambulatory Visit: Payer: Self-pay

## 2020-04-13 ENCOUNTER — Ambulatory Visit: Payer: Self-pay | Admitting: Internal Medicine

## 2020-04-13 VITALS — BP 130/70 | HR 88 | Temp 98.2°F | Ht 62.0 in | Wt 135.8 lb

## 2020-04-13 DIAGNOSIS — Z0000 Encounter for general adult medical examination without abnormal findings: Secondary | ICD-10-CM | POA: Diagnosis not present

## 2020-04-13 DIAGNOSIS — Z23 Encounter for immunization: Secondary | ICD-10-CM

## 2020-04-13 MED ORDER — BOOSTRIX 5-2.5-18.5 LF-MCG/0.5 IM SUSP: 0.5000 mL | Freq: Once | INTRAMUSCULAR | 0 refills | Status: AC

## 2020-04-13 NOTE — Patient Instructions (Signed)
Patricia Soto , Thank you for taking time to come for your Medicare Wellness Visit. I appreciate your ongoing commitment to your health goals. Please review the following plan we discussed and let me know if I can assist you in the future.   Screening recommendations/referrals: Colonoscopy: 02/2011 Mammogram: 05/2019 Bone Density: 07/2018 Recommended yearly ophthalmology/optometry visit for glaucoma screening and checkup Recommended yearly dental visit for hygiene and checkup  Vaccinations: Influenza vaccine: 08/2019 Pneumococcal vaccine: 05/2015 Tdap vaccine: 11/2009 Shingles vaccine: discussed    Advanced directives: Please bring a copy of your POA (Power of Brave) and/or Living Will to your next appointment.   Conditions/risks identified: none  Next appointment: 06/15/2020 at 11:00   Preventive Care 74 Years and Older, Female Preventive care refers to lifestyle choices and visits with your health care provider that can promote health and wellness. What does preventive care include?  A yearly physical exam. This is also called an annual well check.  Dental exams once or twice a year.  Routine eye exams. Ask your health care provider how often you should have your eyes checked.  Personal lifestyle choices, including:  Daily care of your teeth and gums.  Regular physical activity.  Eating a healthy diet.  Avoiding tobacco and drug use.  Limiting alcohol use.  Practicing safe sex.  Taking low-dose aspirin every day.  Taking vitamin and mineral supplements as recommended by your health care provider. What happens during an annual well check? The services and screenings done by your health care provider during your annual well check will depend on your age, overall health, lifestyle risk factors, and family history of disease. Counseling  Your health care provider may ask you questions about your:  Alcohol use.  Tobacco use.  Drug use.  Emotional  well-being.  Home and relationship well-being.  Sexual activity.  Eating habits.  History of falls.  Memory and ability to understand (cognition).  Work and work Astronomer.  Reproductive health. Screening  You may have the following tests or measurements:  Height, weight, and BMI.  Blood pressure.  Lipid and cholesterol levels. These may be checked every 5 years, or more frequently if you are over 42 years old.  Skin check.  Lung cancer screening. You may have this screening every year starting at age 59 if you have a 30-pack-year history of smoking and currently smoke or have quit within the past 15 years.  Fecal occult blood test (FOBT) of the stool. You may have this test every year starting at age 90.  Flexible sigmoidoscopy or colonoscopy. You may have a sigmoidoscopy every 5 years or a colonoscopy every 10 years starting at age 35.  Hepatitis C blood test.  Hepatitis B blood test.  Sexually transmitted disease (STD) testing.  Diabetes screening. This is done by checking your blood sugar (glucose) after you have not eaten for a while (fasting). You may have this done every 1-3 years.  Bone density scan. This is done to screen for osteoporosis. You may have this done starting at age 79.  Mammogram. This may be done every 1-2 years. Talk to your health care provider about how often you should have regular mammograms. Talk with your health care provider about your test results, treatment options, and if necessary, the need for more tests. Vaccines  Your health care provider may recommend certain vaccines, such as:  Influenza vaccine. This is recommended every year.  Tetanus, diphtheria, and acellular pertussis (Tdap, Td) vaccine. You may need a Td booster every  10 years.  Zoster vaccine. You may need this after age 50.  Pneumococcal 13-valent conjugate (PCV13) vaccine. One dose is recommended after age 75.  Pneumococcal polysaccharide (PPSV23) vaccine. One  dose is recommended after age 72. Talk to your health care provider about which screenings and vaccines you need and how often you need them. This information is not intended to replace advice given to you by your health care provider. Make sure you discuss any questions you have with your health care provider. Document Released: 11/24/2015 Document Revised: 07/17/2016 Document Reviewed: 08/29/2015 Elsevier Interactive Patient Education  2017 [place] Prevention in the Home Falls can cause injuries. They can happen to people of all ages. There are many things you can do to make your home safe and to help prevent falls. What can I do on the outside of my home?  Regularly fix the edges of walkways and driveways and fix any cracks.  Remove anything that might make you trip as you walk through a door, such as a raised step or threshold.  Trim any bushes or trees on the path to your home.  Use bright outdoor lighting.  Clear any walking paths of anything that might make someone trip, such as rocks or tools.  Regularly check to see if handrails are loose or broken. Make sure that both sides of any steps have handrails.  Any raised decks and porches should have guardrails on the edges.  Have any leaves, snow, or ice cleared regularly.  Use sand or salt on walking paths during winter.  Clean up any spills in your garage right away. This includes oil or grease spills. What can I do in the bathroom?  Use night lights.  Install grab bars by the toilet and in the tub and shower. Do not use towel bars as grab bars.  Use non-skid mats or decals in the tub or shower.  If you need to sit down in the shower, use a plastic, non-slip stool.  Keep the floor dry. Clean up any water that spills on the floor as soon as it happens.  Remove soap buildup in the tub or shower regularly.  Attach bath mats securely with double-sided non-slip rug tape.  Do not have throw rugs and other  things on the floor that can make you trip. What can I do in the bedroom?  Use night lights.  Make sure that you have a light by your bed that is easy to reach.  Do not use any sheets or blankets that are too big for your bed. They should not hang down onto the floor.  Have a firm chair that has side arms. You can use this for support while you get dressed.  Do not have throw rugs and other things on the floor that can make you trip. What can I do in the kitchen?  Clean up any spills right away.  Avoid walking on wet floors.  Keep items that you use a lot in easy-to-reach places.  If you need to reach something above you, use a strong step stool that has a grab bar.  Keep electrical cords out of the way.  Do not use floor polish or wax that makes floors slippery. If you must use wax, use non-skid floor wax.  Do not have throw rugs and other things on the floor that can make you trip. What can I do with my stairs?  Do not leave any items on the stairs.  Make sure  that there are handrails on both sides of the stairs and use them. Fix handrails that are broken or loose. Make sure that handrails are as long as the stairways.  Check any carpeting to make sure that it is firmly attached to the stairs. Fix any carpet that is loose or worn.  Avoid having throw rugs at the top or bottom of the stairs. If you do have throw rugs, attach them to the floor with carpet tape.  Make sure that you have a light switch at the top of the stairs and the bottom of the stairs. If you do not have them, ask someone to add them for you. What else can I do to help prevent falls?  Wear shoes that:  Do not have high heels.  Have rubber bottoms.  Are comfortable and fit you well.  Are closed at the toe. Do not wear sandals.  If you use a stepladder:  Make sure that it is fully opened. Do not climb a closed stepladder.  Make sure that both sides of the stepladder are locked into place.  Ask  someone to hold it for you, if possible.  Clearly mark and make sure that you can see:  Any grab bars or handrails.  First and last steps.  Where the edge of each step is.  Use tools that help you move around (mobility aids) if they are needed. These include:  Canes.  Walkers.  Scooters.  Crutches.  Turn on the lights when you go into a dark area. Replace any light bulbs as soon as they burn out.  Set up your furniture so you have a clear path. Avoid moving your furniture around.  If any of your floors are uneven, fix them.  If there are any pets around you, be aware of where they are.  Review your medicines with your doctor. Some medicines can make you feel dizzy. This can increase your chance of falling. Ask your doctor what other things that you can do to help prevent falls. This information is not intended to replace advice given to you by your health care provider. Make sure you discuss any questions you have with your health care provider. Document Released: 08/24/2009 Document Revised: 04/04/2016 Document Reviewed: 12/02/2014 Elsevier Interactive Patient Education  2017 [employer].

## 2020-04-13 NOTE — Progress Notes (Signed)
This visit occurred during the SARS-CoV-2 public health emergency.  Safety protocols were in place, including screening questions prior to the visit, additional usage of staff PPE, and extensive cleaning of exam room while observing appropriate contact time as indicated for disinfecting solutions.  Subjective:   Patricia Soto is a 74 y.o. female who presents for Medicare Annual (Subsequent) preventive examination.  Review of Systems:  n/a Cardiac Risk Factors include: advanced age (>13men, >4 women);hypertension     Objective:     Vitals: BP 130/70 (BP Location: Left Arm, Patient Position: Sitting, Cuff Size: Normal)   Pulse 88   Temp 98.2 F (36.8 C) (Oral)   Ht 5\' 2"  (1.575 m)   Wt 135 lb 12.8 oz (61.6 kg)   SpO2 99%   BMI 24.84 kg/m   Body mass index is 24.84 kg/m.  Advanced Directives 04/13/2020 06/17/2019 10/01/2018 10/30/2015 10/20/2015  Does Patient Have a Medical Advance Directive? Yes Yes Yes - Yes  Type of Advance Directive [place];Living will Living will Living will - Living will;Healthcare Power of Attorney  Does patient want to make changes to medical advance directive? - - No - Patient declined - No - Patient declined  Copy of [place] in Chart? No - copy requested - - No - copy requested No - copy requested    Tobacco Social History   Tobacco Use  Smoking Status Never Smoker  Smokeless Tobacco Never Used     Counseling given: Not Answered   Clinical Intake:  Pre-visit preparation completed: Yes  Pain : No/denies pain     Nutritional Status: BMI of 19-24  Normal Nutritional Risks: None Diabetes: No  How often do you need to have someone help you when you read instructions, pamphlets, or other written materials from your doctor or pharmacy?: 1 - Never What is the last grade level you completed in school?: graduate degree  Interpreter Needed?: No  Information entered by :: NAllen LPN  Past Medical History:   Diagnosis Date  . Anemia    hx teen  . Angioedema 05/21/2019  . Arthritis   . Heart murmur    hx  . Hypertension    Past Surgical History:  Procedure Laterality Date  . COLPOSCOPY  1991  . FOOT SURGERY Bilateral    bunions93,2013 rt,94 ,2012,lft toes2008lft  . MYOMECTOMY  90  . PARATHYROIDECTOMY N/A 10/30/2015   Procedure: PARATHYROIDECTOMY;  Surgeon: Armandina Gemma, MD;  Location: [facility] OR;  Service: General;  Laterality: N/A;   Family History  Problem Relation Age of Onset  . Diabetes Father   . Congenital heart disease Father   . Hypertension Father   . Heart attack Father   . Hypertension Sister   . Hypertension Sister   . Cancer Sister   . Hypertension Mother   . Other Mother        amyloidosis   Social History   Socioeconomic History  . Marital status: Single    Spouse name: Not on file  . Number of children: Not on file  . Years of education: Not on file  . Highest education level: Not on file  Occupational History  . Occupation: retired  Tobacco Use  . Smoking status: Never Smoker  . Smokeless tobacco: Never Used  Substance and Sexual Activity  . Alcohol use: Yes    Alcohol/week: 3.0 - 4.0 standard drinks    Types: 3 - 4 Glasses of wine per week    Comment: WINE -   .  Drug use: No  . Sexual activity: Not Currently    Partners: Male    Comment: 1ST  intercourse- 26, partners - refused to answer   Other Topics Concern  . Not on file  Social History Narrative  . Not on file   Social Determinants of Health   Financial Resource Strain: Low Risk   . Difficulty of Paying Living Expenses: Not hard at all  Food Insecurity: No Food Insecurity  . Worried About Programme researcher, broadcasting/film/video in the Last Year: Never true  . Ran Out of Food in the Last Year: Never true  Transportation Needs: No Transportation Needs  . Lack of Transportation (Medical): No  . Lack of Transportation (Non-Medical): No  Physical Activity: Sufficiently Active  . Days of Exercise per Week: 5  days  . Minutes of Exercise per Session: 60 min  Stress: No Stress Concern Present  . Feeling of Stress : Not at all  Social Connections:   . Frequency of Communication with Friends and Family:   . Frequency of Social Gatherings with Friends and Family:   . Attends Religious Services:   . Active Member of Clubs or Organizations:   . Attends Banker Meetings:   Marland Kitchen Marital Status:     Outpatient Encounter Medications as of 04/13/2020  Medication Sig  . Cholecalciferol (VITAMIN D3) 250 MCG (10000 UT) capsule   . Ginger, Zingiber officinalis, (GINGER ROOT) 550 MG CAPS Take 1 capsule by mouth daily.  . Magnesium 500 MG CAPS   . Omega-3 1000 MG CAPS   . telmisartan (MICARDIS) 20 MG tablet TAKE 1 TABLET BY MOUTH EVERY DAY  . Turmeric Curcumin 500 MG CAPS   . Wheat Dextrin (BENEFIBER ON THE GO) PACK Take 1 packet by mouth 2 (two) times daily.  . Tdap (BOOSTRIX) 5-2.5-18.5 LF-MCG/0.5 injection Inject 0.5 mLs into the muscle once for 1 dose.   No facility-administered encounter medications on file as of 04/13/2020.    Activities of Daily Living In your present state of health, do you have any difficulty performing the following activities: 04/13/2020 06/17/2019  Hearing? N N  Vision? N N  Difficulty concentrating or making decisions? N N  Walking or climbing stairs? N N  Dressing or bathing? N N  Doing errands, shopping? N N  Preparing Food and eating ? N N  Using the Toilet? N N  In the past six months, have you accidently leaked urine? Y Y  Comment if held too long wears panty liners sometimes, holding urine too longer  Do you have problems with loss of bowel control? N N  Managing your Medications? N N  Managing your Finances? N N  Housekeeping or managing your Housekeeping? N N  Some recent data might be hidden    Patient Care Team: Dorothyann Peng, MD as PCP - General (Internal Medicine)    Assessment:   This is a routine wellness examination for Patricia Soto.  Exercise  Activities and Dietary recommendations Current Exercise Habits: Home exercise routine, Type of exercise: walking;calisthenics, Time (Minutes): 60, Frequency (Times/Week): 5, Weekly Exercise (Minutes/Week): 300  Goals    . Blood Pressure < 140/90 (pt-stated)    . Patient Stated     06/17/2019, wants to lose 5 more pounds    . Patient Stated     04/13/2020, wants to lose 5 pounds       Fall Risk Fall Risk  04/13/2020 10/14/2019 07/02/2019 06/17/2019 03/31/2019  Falls in the past year? 0 0 0  0 0  Number falls in past yr: - - 0 - -  Injury with Fall? - - 0 - -  Risk for fall due to : Medication side effect - - Medication side effect -  Follow up Falls evaluation completed;Education provided;Falls prevention discussed - - Falls evaluation completed;Education provided;Falls prevention discussed -   Is the patient's home free of loose throw rugs in walkways, pet beds, electrical cords, etc?   yes      Grab bars in the bathroom? yes      Handrails on the stairs?   yes      Adequate lighting?   yes  Timed Get Up and Go performed: n/a  Depression Screen PHQ 2/9 Scores 04/13/2020 06/17/2019 03/31/2019 10/01/2018  PHQ - 2 Score 0 0 0 0  PHQ- 9 Score - 0 - 1     Cognitive Function     6CIT Screen 04/13/2020 06/17/2019 10/01/2018  What Year? 0 points 0 points 0 points  What month? 0 points 0 points 0 points  What time? 0 points 0 points 0 points  Count back from 20 0 points 0 points 0 points  Months in reverse 2 points 0 points 0 points  Repeat phrase 0 points 0 points 0 points  Total Score 2 0 0    Immunization History  Administered Date(s) Administered  . Influenza, High Dose Seasonal PF 08/29/2018, 08/23/2019  . Influenza-Unspecified 08/23/2019  . Moderna SARS-COVID-2 Vaccination 01/03/2020, 02/01/2020  . Pneumococcal Conjugate-13 06/08/2015  . Pneumococcal Polysaccharide-23 04/29/2014  . Tdap 11/20/2009  . Zoster 05/24/2014    Qualifies for Shingles Vaccine?yes  Screening Tests Health  Maintenance  Topic Date Due  . TETANUS/TDAP  11/21/2019  . INFLUENZA VACCINE  06/11/2020  . COLONOSCOPY  03/04/2021  . MAMMOGRAM  05/31/2021  . DEXA SCAN  Completed  . COVID-19 Vaccine  Completed  . Hepatitis C Screening  Completed  . PNA vac Low Risk Adult  Completed    Cancer Screenings: Lung: Low Dose CT Chest recommended if Age 69-80 years, 30 pack-year currently smoking OR have quit w/in 15years. Patient does not qualify. Breast:  Up to date on Mammogram? Yes   Up to date of Bone Density/Dexa? Yes Colorectal: up to date  Additional Screenings: : Hepatitis C Screening: 04/08/2019     Plan:    Patient would like to lose 5 pounds   I have personally reviewed and noted the following in the patient's chart:   . Medical and social history . Use of alcohol, tobacco or illicit drugs  . Current medications and supplements . Functional ability and status . Nutritional status . Physical activity . Advanced directives . List of other physicians . Hospitalizations, surgeries, and ER visits in previous 12 months . Vitals . Screenings to include cognitive, depression, and falls . Referrals and appointments  In addition, I have reviewed and discussed with patient certain preventive protocols, quality metrics, and best practice recommendations. A written personalized care plan for preventive services as well as general preventive health recommendations were provided to patient.     Barb Merino, LPN  07/14/7341

## 2020-04-17 ENCOUNTER — Other Ambulatory Visit: Payer: Self-pay | Admitting: Internal Medicine

## 2020-04-17 DIAGNOSIS — Z1231 Encounter for screening mammogram for malignant neoplasm of breast: Secondary | ICD-10-CM

## 2020-05-21 IMAGING — US US RENAL
1 series · 13 of 25 positions shown · non-contrast
Comparison: None.

CLINICAL DATA: Microscopic hematuria

EXAM:
RENAL / URINARY TRACT ULTRASOUND COMPLETE

[Series 1: us renal · 0.22mm/px · 13 of 54 slices shown]
[im 1/54]
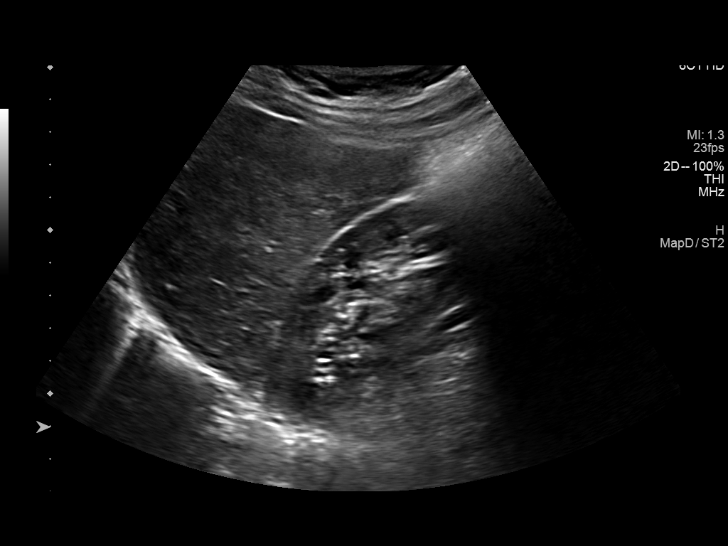
[im 5/54]
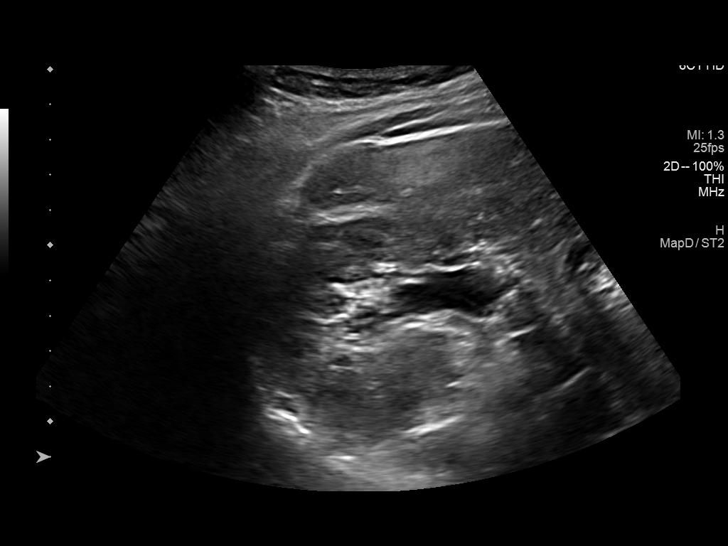
[im 9/54]
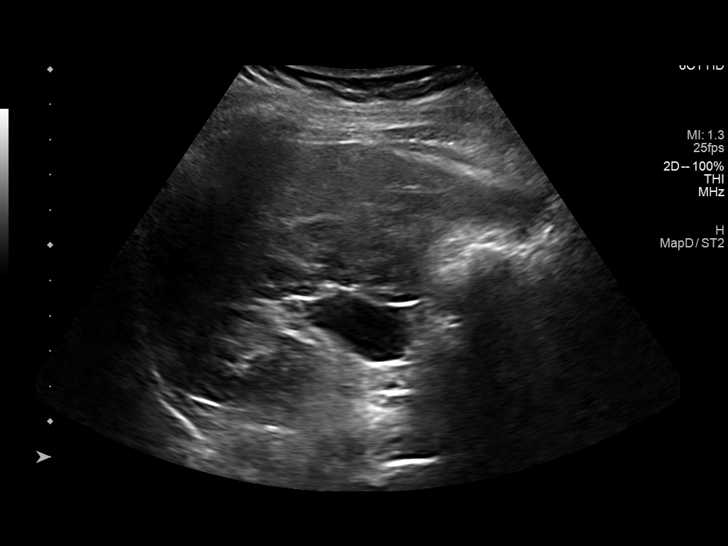
[im 14/54]
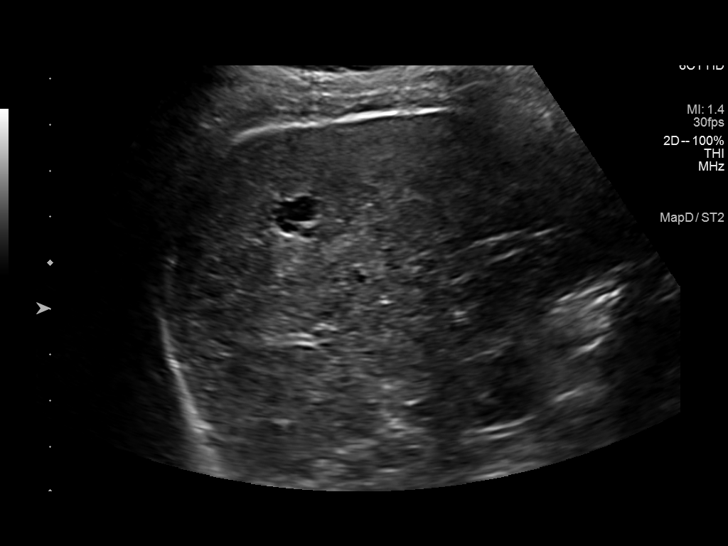
[im 18/54]
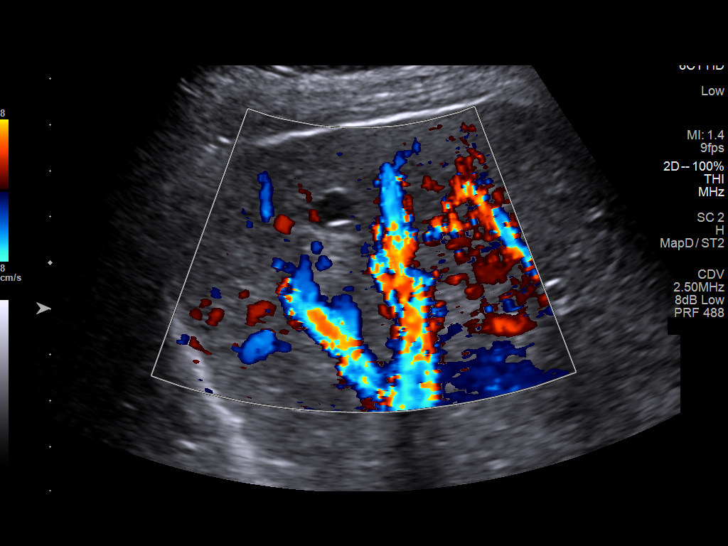
[im 23/54]
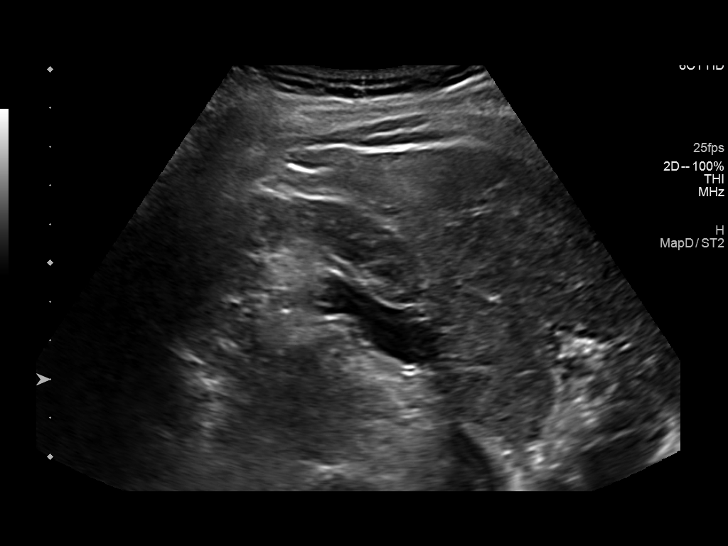
[im 27/54]
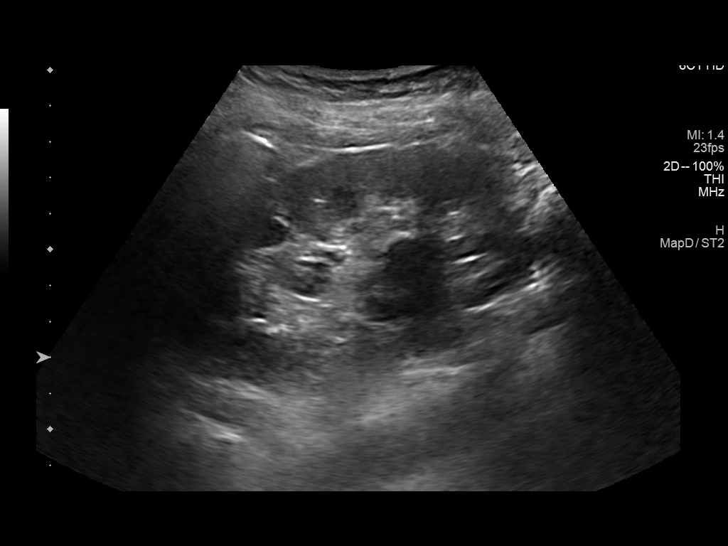
[im 31/54]
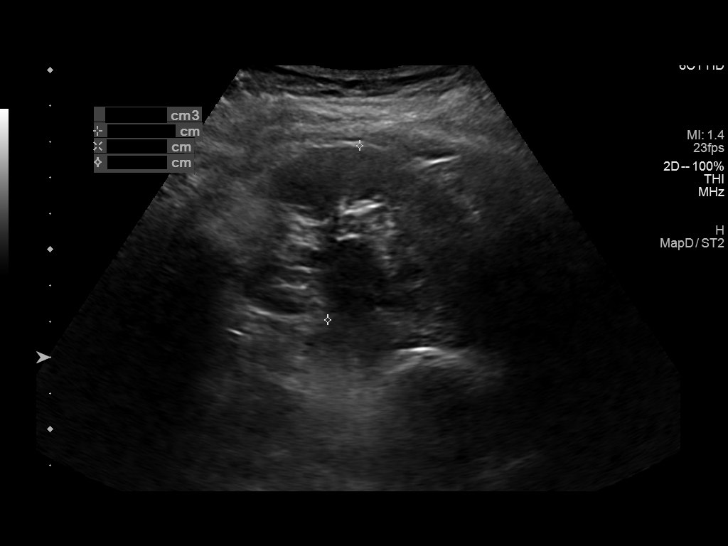
[im 36/54]
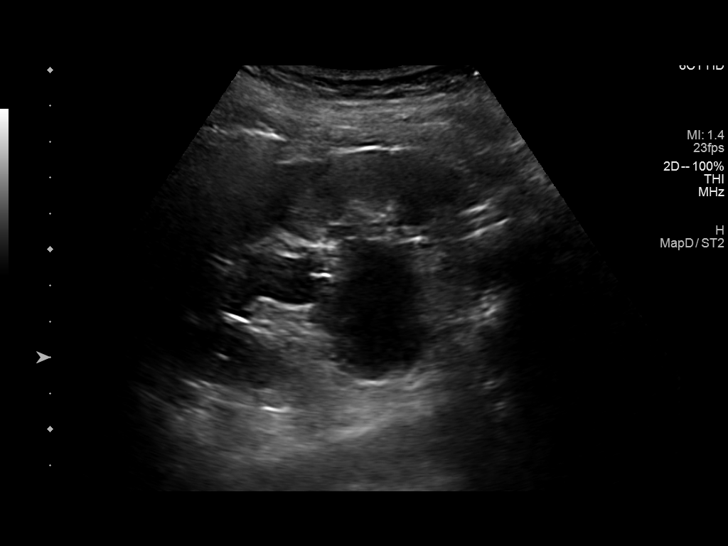
[im 40/54]
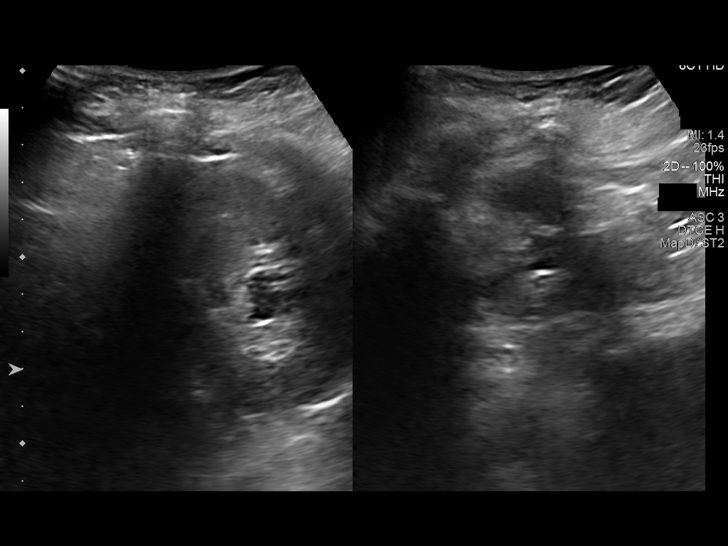
[im 45/54]
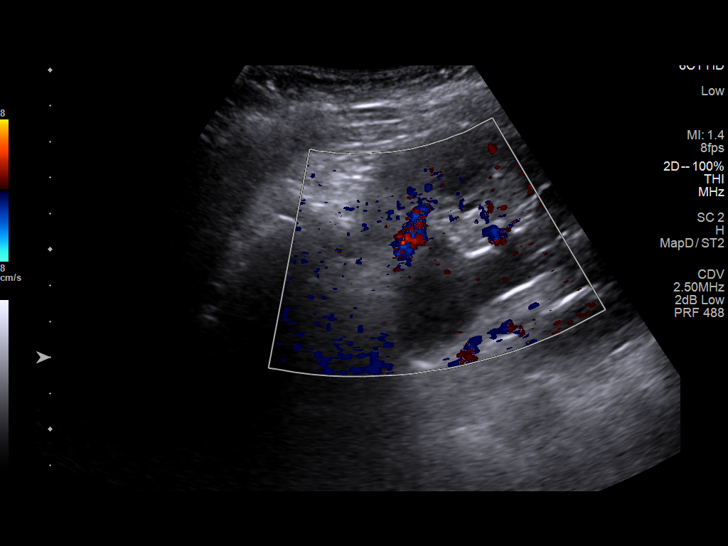
[im 49/54]
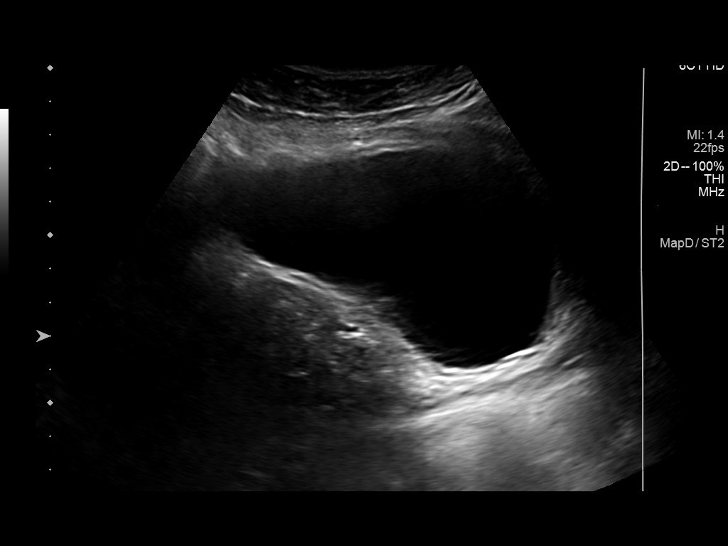
[im 54/54]
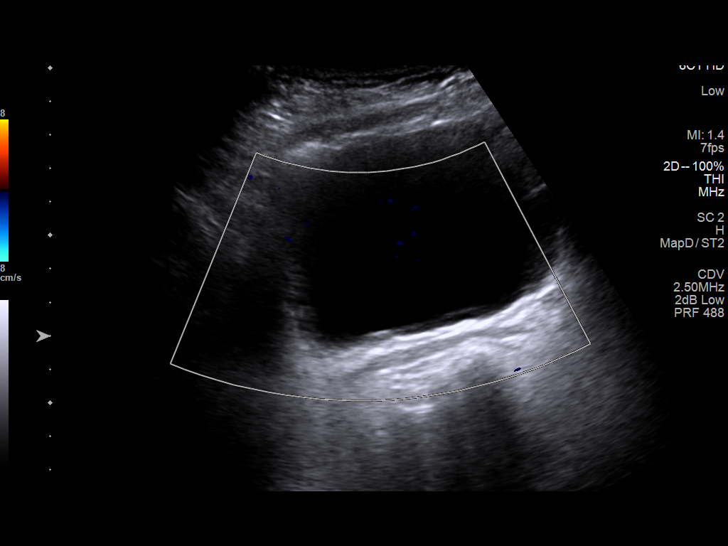

[13 of 25 positions shown; findings below may reference images not displayed]

FINDINGS: Right Kidney:

Renal measurements: 9.4 x 3.3 x 6.2 cm = volume: 102 mL. There is
mild right hydronephrosis. No discrete shadowing calculi. There is a
normal appearance of the renal cortex although the medullary
pyramids demonstrate a diffusely echogenic appearance which could
reflect some underlying medullary sponge kidney or nephrocalcinosis.

Left Kidney:

Renal measurements: 10.3 x 5.6 x 4.9 cm = volume: 149 mL. There is
moderate left hydronephrosis. No discrete shadowing calculi are
seen. There is preserved normal echogenicity of the cortex but with
a diffusely increased echogenic appearance of the renal medulla
suggestive of underlying sponge kidney or nephrocalcinosis.

Bladder:

Appears normal for degree of bladder distention.

Other:

Incidental note made of a 1.0 x 0.8 x 1.1 cm minimally septate cyst
in the right lobe liver
IMPRESSION: Mild right and moderate left hydroureteronephrosis.

Diffusely increased echogenicity of the renal medulla bilaterally
has an appearance worrisome for potential medullary sponge kidney or
nephrocalcinosis particularly in the setting of microscopic
hematuria.

Incidental note made of a 1.1 cm minimally complex septate cyst in
the right lobe liver. No aggressive imaging features. Could consider
six-month follow-up ultrasound.

## 2020-06-01 ENCOUNTER — Ambulatory Visit
Admission: RE | Admit: 2020-06-01 | Discharge: 2020-06-01 | Disposition: A | Discharge status: Home / Self Care | Payer: Medicare PPO | Source: Ambulatory Visit | Attending: Internal Medicine | Admitting: Internal Medicine

## 2020-06-01 ENCOUNTER — Other Ambulatory Visit: Payer: Self-pay

## 2020-06-01 DIAGNOSIS — Z1231 Encounter for screening mammogram for malignant neoplasm of breast: Secondary | ICD-10-CM

## 2020-06-15 ENCOUNTER — Other Ambulatory Visit: Payer: Self-pay

## 2020-06-15 ENCOUNTER — Encounter: Payer: Self-pay | Admitting: Internal Medicine

## 2020-06-15 ENCOUNTER — Ambulatory Visit: Payer: Medicare PPO | Admitting: Internal Medicine

## 2020-06-15 VITALS — BP 124/68 | HR 75 | Temp 97.7°F | Wt 135.6 lb

## 2020-06-15 DIAGNOSIS — N182 Chronic kidney disease, stage 2 (mild): Secondary | ICD-10-CM

## 2020-06-15 DIAGNOSIS — I129 Hypertensive chronic kidney disease with stage 1 through stage 4 chronic kidney disease, or unspecified chronic kidney disease: Secondary | ICD-10-CM

## 2020-06-15 DIAGNOSIS — K7689 Other specified diseases of liver: Secondary | ICD-10-CM

## 2020-06-15 LAB — POCT URINALYSIS DIPSTICK
Bilirubin, UA: NEGATIVE
Glucose, UA: NEGATIVE
Ketones, UA: NEGATIVE
Leukocytes, UA: NEGATIVE
Nitrite, UA: NEGATIVE
Protein, UA: NEGATIVE
Spec Grav, UA: 1.01 (ref 1.010–1.025)
Urobilinogen, UA: 0.2 E.U./dL
pH, UA: 6.5 (ref 5.0–8.0)

## 2020-06-15 LAB — POCT UA - MICROALBUMIN
Albumin/Creatinine Ratio, Urine, POC: 30
Creatinine, POC: 10 mg/dL
Microalbumin Ur, POC: 10 mg/L

## 2020-06-15 MED ORDER — TELMISARTAN 20 MG PO TABS: 20.0000 mg | ORAL_TABLET | Freq: Every day | ORAL | 1 refills | Status: DC

## 2020-06-15 NOTE — Progress Notes (Signed)
I,Katawbba Wiggins,acting as a Education administrator for Maximino Greenland, MD.,have documented all relevant documentation on the behalf of Maximino Greenland, MD,as directed by  Maximino Greenland, MD while in the presence of Maximino Greenland, MD.  This visit occurred during the SARS-CoV-2 public health emergency.  Safety protocols were in place, including screening questions prior to the visit, additional usage of staff PPE, and extensive cleaning of exam room while observing appropriate contact time as indicated for disinfecting solutions.  Subjective:     Patient ID: Patricia Soto , female    DOB: 03/26/1946 , 74 y.o.   MRN: [id number]   Chief Complaint  Patient presents with  . Hypertension    HPI  She presents today for htn f/u. She reports compliance with meds. She has no specific concerns or complaints at this time.   Hypertension This is a chronic problem. The current episode started more than 1 year ago. The problem has been gradually improving since onset. The problem is controlled. Pertinent negatives include no blurred vision, chest pain, palpitations or shortness of breath. Past treatments include angiotensin blockers and diuretics. The current treatment provides moderate improvement. There are no compliance problems.      Past Medical History:  Diagnosis Date  . Anemia    hx teen  . Angioedema 05/21/2019  . Arthritis   . Heart murmur    hx  . Hypertension      Family History  Problem Relation Age of Onset  . Diabetes Father   . Congenital heart disease Father   . Hypertension Father   . Heart attack Father   . Hypertension Sister   . Hypertension Sister   . Cancer Sister   . Hypertension Mother   . Other Mother        amyloidosis     Current Outpatient Medications:  .  Cholecalciferol (VITAMIN D3) 250 MCG (10000 UT) capsule, , Disp: , Rfl:  .  Ginger, Zingiber officinalis, (GINGER ROOT) 550 MG CAPS, Take 1 capsule by mouth daily., Disp: , Rfl:  .  Magnesium 500 MG CAPS, ,  Disp: , Rfl:  .  Omega-3 1000 MG CAPS, , Disp: , Rfl:  .  telmisartan (MICARDIS) 20 MG tablet, Take 1 tablet (20 mg total) by mouth daily., Disp: 90 tablet, Rfl: 1 .  Turmeric Curcumin 500 MG CAPS, , Disp: , Rfl:  .  Wheat Dextrin (BENEFIBER ON THE GO) PACK, Take 1 packet by mouth 2 (two) times daily., Disp: , Rfl:    No Known Allergies   Review of Systems  Constitutional: Negative.   Eyes: Negative for blurred vision.  Respiratory: Negative.  Negative for shortness of breath.   Cardiovascular: Negative.  Negative for chest pain and palpitations.  Gastrointestinal: Negative.   Psychiatric/Behavioral: Negative.   All other systems reviewed and are negative.    Today's Vitals   06/15/20 1056  BP: 124/68  Pulse: 75  Temp: 97.7 F (36.5 C)  TempSrc: Oral  Weight: 135 lb 9.6 oz (61.5 kg)  PainSc: 0-No pain   Body mass index is 24.8 kg/m.  Wt Readings from Last 3 Encounters:  06/15/20 135 lb 9.6 oz (61.5 kg)  04/13/20 135 lb 12.8 oz (61.6 kg)  12/01/19 138 lb 6.4 oz (62.8 kg)   Objective:  Physical Exam Vitals and nursing note reviewed.  Constitutional:      Appearance: Normal appearance. She is obese.  HENT:     Head: Normocephalic and atraumatic.  Cardiovascular:  Rate and Rhythm: Normal rate and regular rhythm.     Heart sounds: Normal heart sounds.  Pulmonary:     Breath sounds: Normal breath sounds.  Skin:    General: Skin is warm.  Neurological:     General: No focal deficit present.     Mental Status: She is alert and oriented to person, place, and time.         Assessment And Plan:     1. Hypertensive nephropathy Comments: Chronic, well controlled. She will continue with current meds. She will rto in Dec 2021 for her next physical exam.  - BMP8+EGFR - POCT Urinalysis Dipstick (81002) - POCT UA - Microalbumin - CBC no Diff  2. Chronic renal disease, stage II Comments: Chronic, this has been stable. I will check renal function today.   3. Liver  cyst Comments: Previous renal u/s results reviewed. This was an incidental finding. She agrees to f/u RUQ ultrasound.  - US Abdomen Limited RUQ; Future     Patient was given opportunity to ask questions. Patient verbalized understanding of the plan and was able to repeat key elements of the plan. All questions were answered to their satisfaction.  Maximino Greenland, MD   I, Maximino Greenland, MD, have reviewed all documentation for this visit. The documentation on 06/15/20 for the exam, diagnosis, procedures, and orders are all accurate and complete.  THE PATIENT IS ENCOURAGED TO PRACTICE SOCIAL DISTANCING DUE TO THE COVID-19 PANDEMIC.

## 2020-06-15 NOTE — Patient Instructions (Signed)

## 2020-06-16 LAB — CBC
Hematocrit: 35.2 % (ref 34.0–46.6)
Hemoglobin: 11.6 g/dL (ref 11.1–15.9)
MCH: 31.3 pg (ref 26.6–33.0)
MCHC: 33 g/dL (ref 31.5–35.7)
MCV: 95 fL (ref 79–97)
Platelets: 164 10*3/uL (ref 150–450)
RBC: 3.71 x10E6/uL — ABNORMAL LOW (ref 3.77–5.28)
RDW: 12.3 % (ref 11.7–15.4)
WBC: 3.8 10*3/uL (ref 3.4–10.8)

## 2020-06-16 LAB — BMP8+EGFR
BUN/Creatinine Ratio: 14 (ref 12–28)
BUN: 13 mg/dL (ref 8–27)
CO2: 23 mmol/L (ref 20–29)
Calcium: 9.7 mg/dL (ref 8.7–10.3)
Chloride: 103 mmol/L (ref 96–106)
Creatinine, Ser: 0.9 mg/dL (ref 0.57–1.00)
GFR calc Af Amer: 73 mL/min/{1.73_m2} (ref >59)
GFR calc non Af Amer: 64 mL/min/{1.73_m2} (ref >59)
Glucose: 84 mg/dL (ref 65–99)
Potassium: 3.6 mmol/L (ref 3.5–5.2)
Sodium: 141 mmol/L (ref 134–144)

## 2020-06-18 IMAGING — US US THYROID
1 series · 14 of 25 positions shown · non-contrast
Comparison: 07/05/2015 parathyroid scan

CLINICAL DATA: History of left parathyroid adenoma, surgically
removed

EXAM:
THYROID ULTRASOUND
TECHNIQUE: Ultrasound examination of the thyroid gland and adjacent soft
tissues was performed.

[Series 1: us thyroid · 0.04mm/px · 14 of 50 slices shown]
[im 1/50]
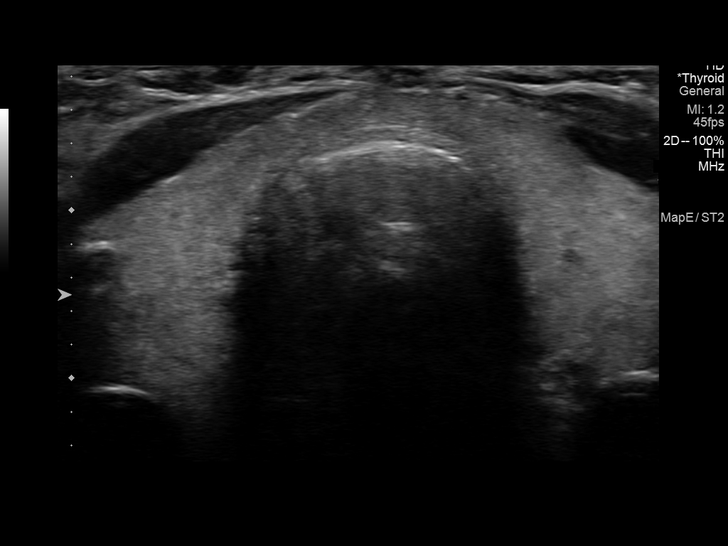
[im 5/50]
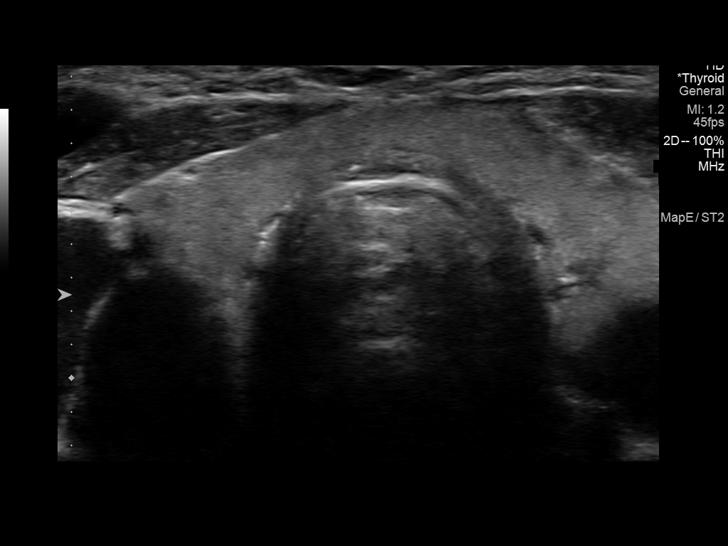
[im 9/50]
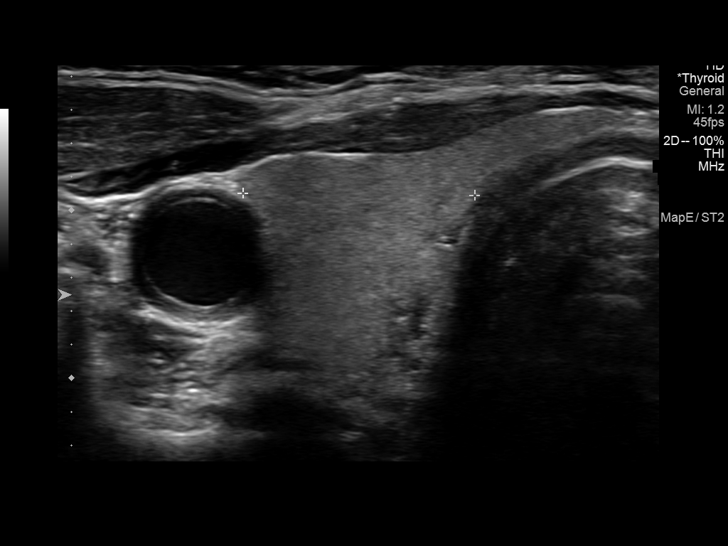
[im 13/50]
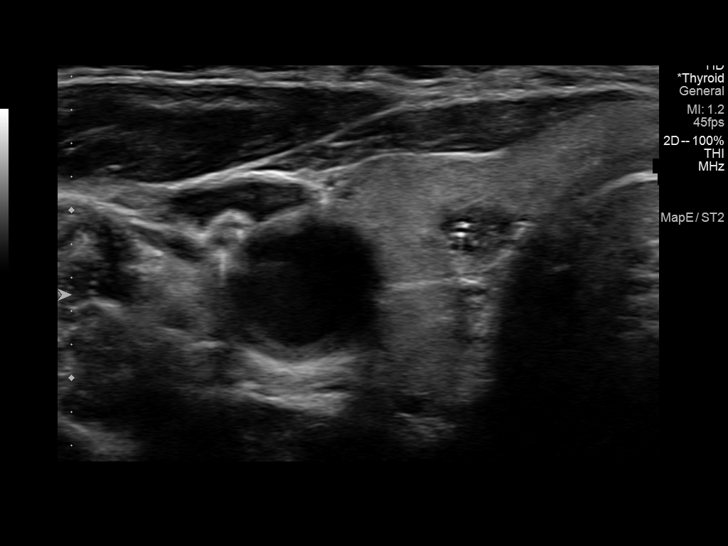
[im 17/50]
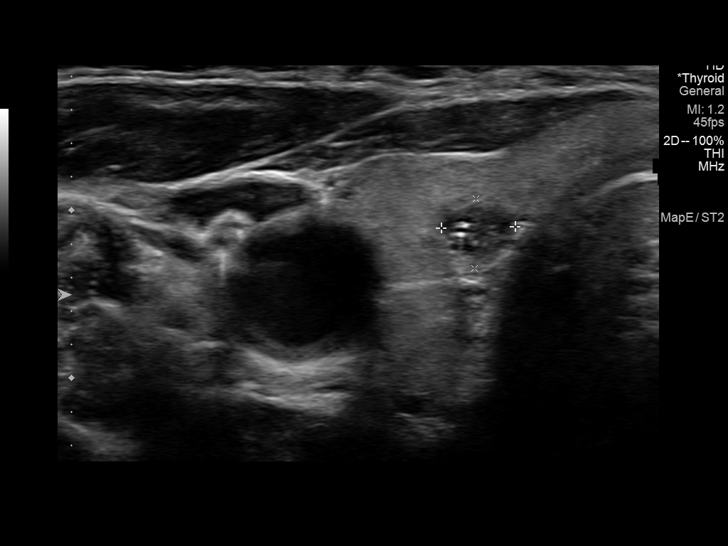
[im 19/50]
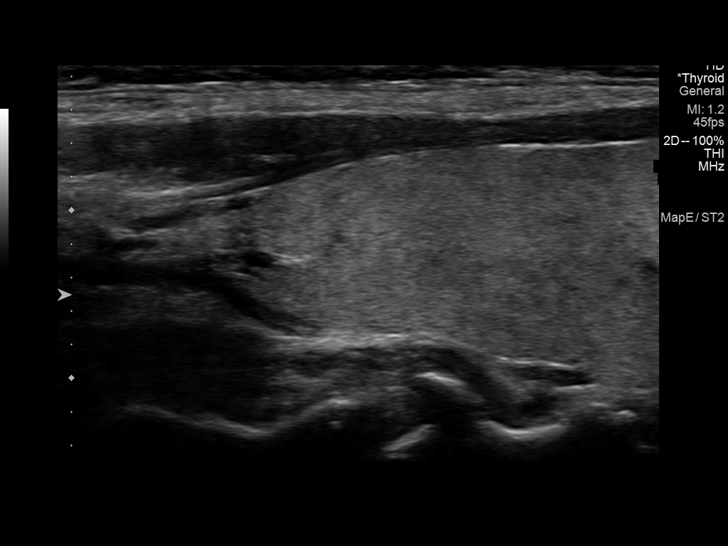
[im 23/50]
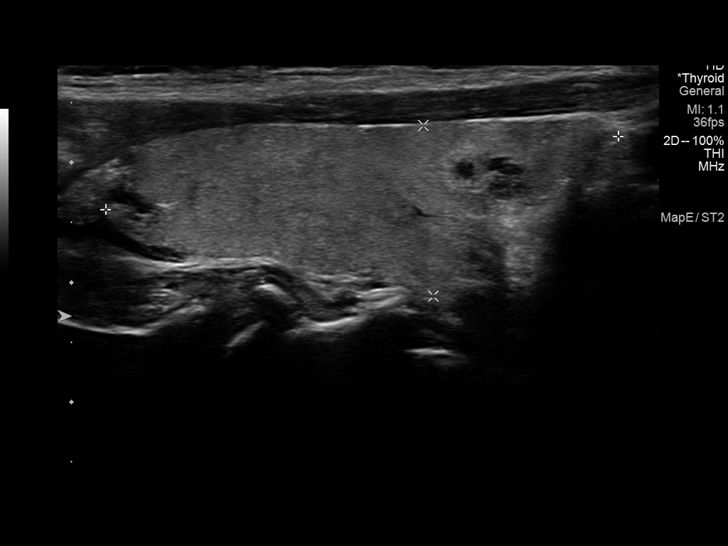
[im 27/50]
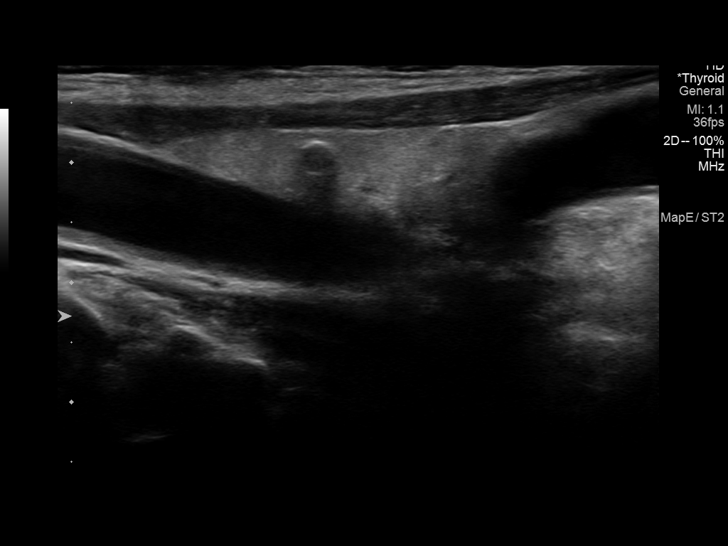
[im 31/50]
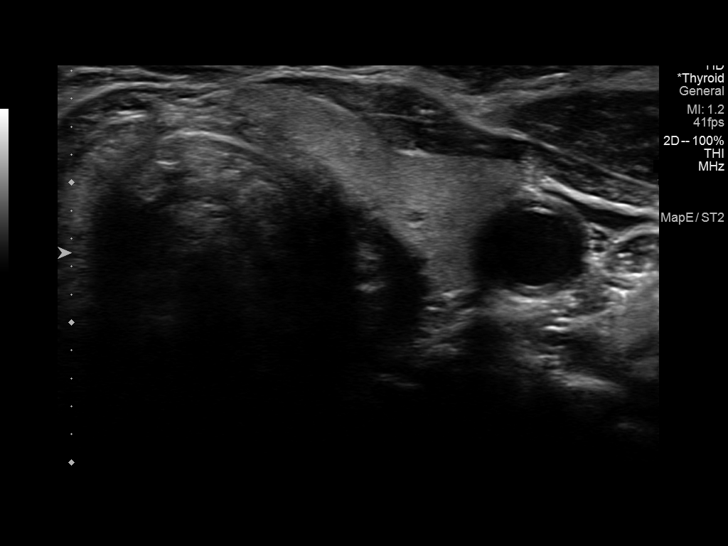
[im 33/50]
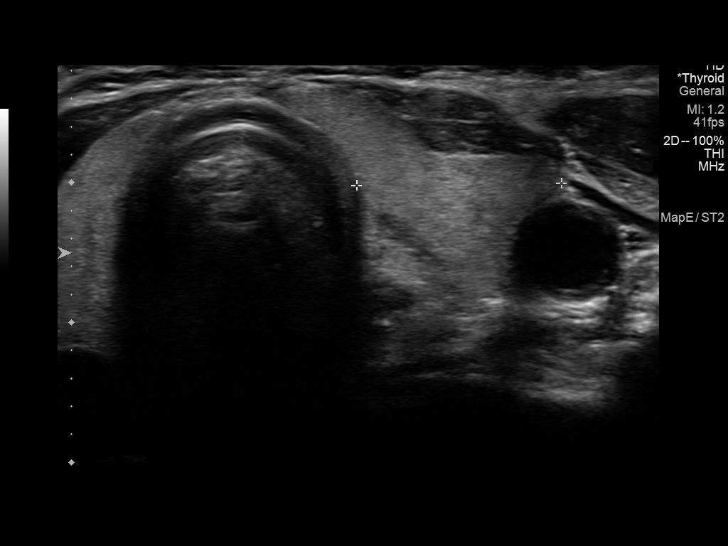
[im 37/50]
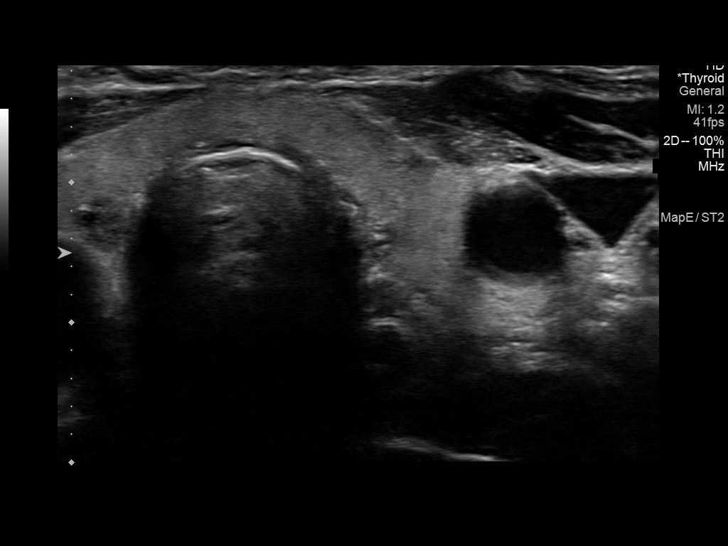
[im 41/50]
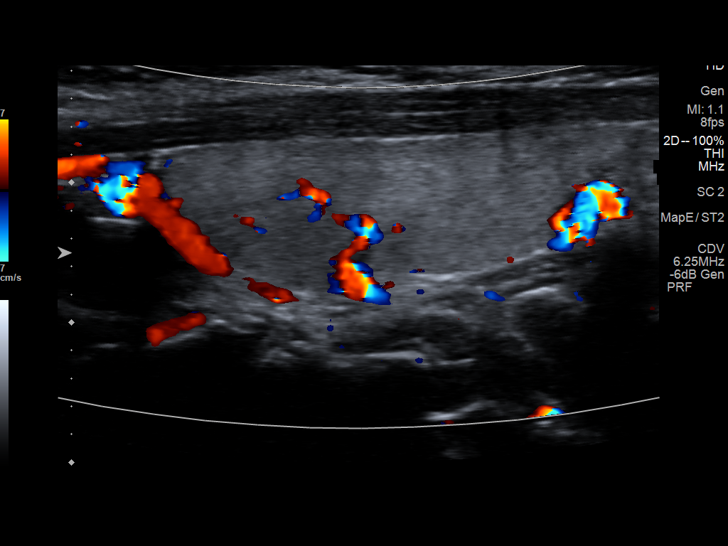
[im 45/50]
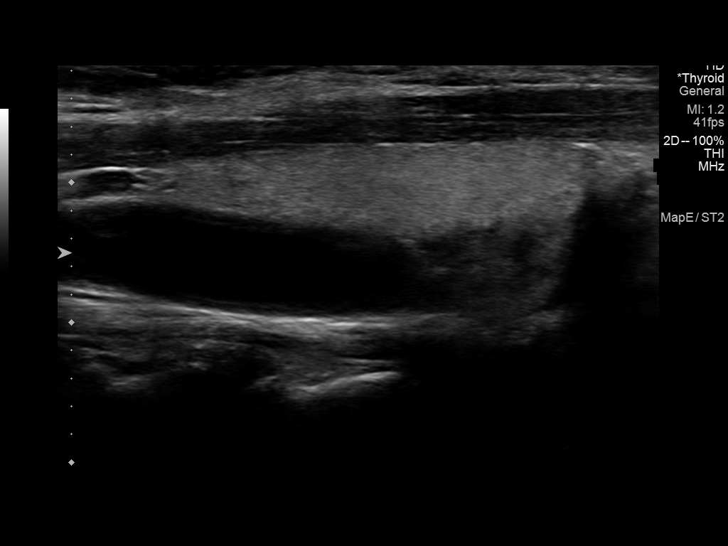
[im 50/50]
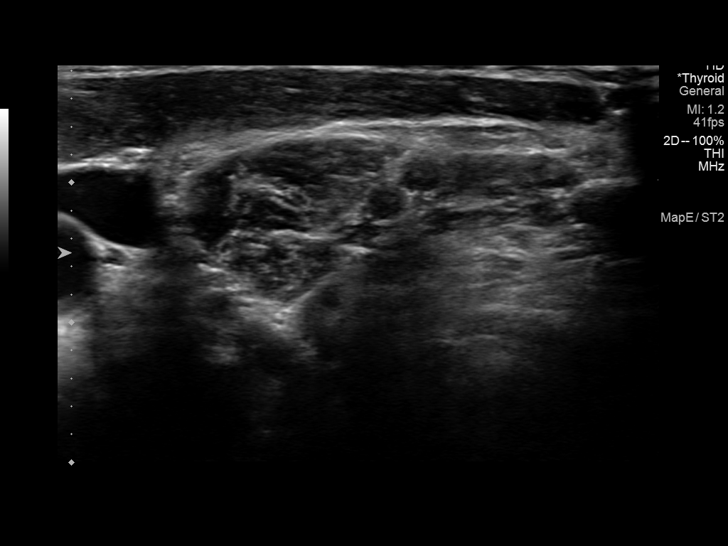

[14 of 25 positions shown; findings below may reference images not displayed]

FINDINGS: Parenchymal Echotexture: Normal

Isthmus: 3 mm

Right lobe: 4.3 x 1.4 x 1.4 cm

Left lobe: 3.6 x 1.1 x 1.5 cm

_________________________________________________________

Estimated total number of nodules >/= 1 cm: 0

Number of spongiform nodules >/=  2 cm not described below (TR1): 0

Number of mixed cystic and solid nodules >/= 1.5 cm not described
below (TR2): 0

_________________________________________________________

Normal thyroid echotexture. Benign subcentimeter cystic and mixed
cystic/solid nodules in the right lobe all measure 7 mm or less in
size. These would not meet criteria for any biopsy or follow-up. No
left thyroid abnormality.

No hypervascularity or regional adenopathy.
IMPRESSION: Benign subcentimeter right thyroid cystic and partially cystic
nodules noted. No other significant finding by ultrasound.

The above is in keeping with the ACR TI-RADS recommendations - [HOSPITAL] 7991;[DATE].

## 2020-06-21 ENCOUNTER — Ambulatory Visit: Payer: Medicare Other

## 2020-06-21 ENCOUNTER — Ambulatory Visit: Payer: Medicare Other | Admitting: Internal Medicine

## 2020-07-05 ENCOUNTER — Ambulatory Visit
Admission: RE | Admit: 2020-07-05 | Discharge: 2020-07-05 | Disposition: A | Discharge status: Home / Self Care | Payer: Medicare PPO | Source: Ambulatory Visit | Attending: Internal Medicine | Admitting: Internal Medicine

## 2020-07-05 DIAGNOSIS — K7689 Other specified diseases of liver: Secondary | ICD-10-CM | POA: Diagnosis not present

## 2020-07-06 ENCOUNTER — Encounter: Payer: Medicare PPO | Admitting: Obstetrics & Gynecology

## 2020-07-28 ENCOUNTER — Ambulatory Visit (INDEPENDENT_AMBULATORY_CARE_PROVIDER_SITE_OTHER): Payer: Medicare PPO | Admitting: Obstetrics & Gynecology

## 2020-07-28 ENCOUNTER — Other Ambulatory Visit: Payer: Self-pay

## 2020-07-28 ENCOUNTER — Encounter: Payer: Self-pay | Admitting: Obstetrics & Gynecology

## 2020-07-28 VITALS — BP 132/80 | Ht 59.75 in | Wt 136.0 lb

## 2020-07-28 DIAGNOSIS — Z780 Asymptomatic menopausal state: Secondary | ICD-10-CM

## 2020-07-28 DIAGNOSIS — M8589 Other specified disorders of bone density and structure, multiple sites: Secondary | ICD-10-CM

## 2020-07-28 DIAGNOSIS — Z124 Encounter for screening for malignant neoplasm of cervix: Secondary | ICD-10-CM

## 2020-07-28 DIAGNOSIS — Z01419 Encounter for gynecological examination (general) (routine) without abnormal findings: Secondary | ICD-10-CM | POA: Diagnosis not present

## 2020-07-28 NOTE — Addendum Note (Signed)
Addended by: Berna Spare A on: 07/28/2020 04:35 PM   Modules accepted: Orders

## 2020-07-28 NOTE — Progress Notes (Signed)
Patricia Soto [birth year] [id number]   History:    74 y.o. G0 single  RP:  Established patient presenting for annual gyn exam   HPI: Menopause, well on no HRT.No postmenopausal bleeding. No pelvic pain. Abstinent. Urine and bowel movements normal. Breasts normal. Patient is very active, walking regularly, every day and doing small weights. Body mass index 26.78. Health labs with family physician. Last colonoscopy in 2012.  Past medical history,surgical history, family history and social history were all reviewed and documented in the EPIC chart.  Gynecologic History No LMP recorded. Patient is postmenopausal.  Obstetric History OB History  Gravida Para Term Preterm AB Living  0 0 0 0 0 0  SAB TAB Ectopic Multiple Live Births  0 0 0 0 0     ROS: A ROS was performed and pertinent positives and negatives are included in the history.  GENERAL: No fevers or chills. HEENT: No change in vision, no earache, sore throat or sinus congestion. NECK: No pain or stiffness. CARDIOVASCULAR: No chest pain or pressure. No palpitations. PULMONARY: No shortness of breath, cough or wheeze. GASTROINTESTINAL: No abdominal pain, nausea, vomiting or diarrhea, melena or bright red blood per rectum. GENITOURINARY: No urinary frequency, urgency, hesitancy or dysuria. MUSCULOSKELETAL: No joint or muscle pain, no back pain, no recent trauma. DERMATOLOGIC: No rash, no itching, no lesions. ENDOCRINE: No polyuria, polydipsia, no heat or cold intolerance. No recent change in weight. HEMATOLOGICAL: No anemia or easy bruising or bleeding. NEUROLOGIC: No headache, seizures, numbness, tingling or weakness. PSYCHIATRIC: No depression, no loss of interest in normal activity or change in sleep pattern.     Exam:   BP 132/80   Ht 4' 11.75" (1.518 m)   Wt 136 lb (61.7 kg)   BMI 26.78 kg/m   Body mass index is 26.78 kg/m.  General appearance : Well developed well nourished female. No acute  distress HEENT: Eyes: no retinal hemorrhage or exudates,  Neck supple, trachea midline, no carotid bruits, no thyroidmegaly Lungs: Clear to auscultation, no rhonchi or wheezes, or rib retractions  Heart: Regular rate and rhythm, no murmurs or gallops Breast:Examined in sitting and supine position were symmetrical in appearance, no palpable masses or tenderness,  no skin retraction, no nipple inversion, no nipple discharge, no skin discoloration, no axillary or supraclavicular lymphadenopathy Abdomen: no palpable masses or tenderness, no rebound or guarding Extremities: no edema or skin discoloration or tenderness  Pelvic: Vulva: Normal             Vagina: No gross lesions or discharge  Cervix: No gross lesions or discharge.  Pap reflex done.  Uterus  AV, normal size, shape and consistency, non-tender and mobile  Adnexa  Without masses or tenderness  Anus: Normal   Assessment/Plan:  74 y.o. female for annual exam   1. Encounter for routine gynecological examination with Papanicolaou smear of cervix Normal gynecologic exam in menopause.  Pap reflex done today.  Breast exam normal.  Screening mammogram negative in July 2021.  Colonoscopy 2012.  Health labs with family physician.  Good body mass index at 26.78.  Continue with fitness and healthy nutrition.  2. Postmenopause Well on no hormone replacement therapy.  No postmenopausal bleeding.  3. Osteopenia of multiple sites Osteopenia on bone density September 2019 with a T score of -2.0.  We will repeat a bone density here now.  Continue with vitamin D supplements, calcium intake of 1200 to 1500 mg daily and regular weightbearing physical activities. - DG Bone  Density; Future  Genia Del MD, 2:39 PM 07/28/2020

## 2020-07-31 LAB — PAP IG W/ RFLX HPV ASCU

## 2020-09-20 ENCOUNTER — Other Ambulatory Visit: Payer: Self-pay

## 2020-09-20 ENCOUNTER — Ambulatory Visit: Payer: Medicare PPO | Admitting: Internal Medicine

## 2020-09-20 ENCOUNTER — Encounter: Payer: Self-pay | Admitting: Internal Medicine

## 2020-09-20 VITALS — BP 132/76 | HR 86 | Temp 98.4°F | Ht 59.75 in | Wt 134.0 lb

## 2020-09-20 DIAGNOSIS — I129 Hypertensive chronic kidney disease with stage 1 through stage 4 chronic kidney disease, or unspecified chronic kidney disease: Secondary | ICD-10-CM | POA: Diagnosis not present

## 2020-09-20 DIAGNOSIS — N182 Chronic kidney disease, stage 2 (mild): Secondary | ICD-10-CM | POA: Diagnosis not present

## 2020-09-20 DIAGNOSIS — E2839 Other primary ovarian failure: Secondary | ICD-10-CM | POA: Diagnosis not present

## 2020-09-20 DIAGNOSIS — Z1211 Encounter for screening for malignant neoplasm of colon: Secondary | ICD-10-CM

## 2020-09-20 NOTE — Patient Instructions (Signed)

## 2020-09-20 NOTE — Progress Notes (Signed)
I,Katawbba Wiggins,acting as a Neurosurgeon for [name], MD.,have documented all relevant documentation on the behalf of [name], MD,as directed by  [name], MD while in the presence of [name], MD.  This visit occurred during the SARS-CoV-2 public health emergency.  Safety protocols were in place, including screening questions prior to the visit, additional usage of staff PPE, and extensive cleaning of exam room while observing appropriate contact time as indicated for disinfecting solutions.  Subjective:     Patient ID: Patricia Soto , female    DOB: 31-Mar-1946 , 74 y.o.   MRN: [id number]   Chief Complaint  Patient presents with  . Hypertension    HPI  She presents today for htn f/u. She has been taking meds without any issues. Denies headaches, chest pain and shortness of breath.  The patient also would like a referral to have a bone density test done.  Hypertension This is a chronic problem. The current episode started more than 1 year ago. The problem has been gradually improving since onset. The problem is controlled. Pertinent negatives include no blurred vision, chest pain, palpitations or shortness of breath. Past treatments include angiotensin blockers and diuretics. The current treatment provides moderate improvement. There are no compliance problems.      Past Medical History:  Diagnosis Date  . Anemia    hx teen  . Angioedema 05/21/2019  . Arthritis   . Heart murmur    hx  . Hypertension      Family History  Problem Relation Age of Onset  . Diabetes Father   . Congenital heart disease Father   . Hypertension Father   . Heart attack Father   . Hypertension Sister   . Hypertension Sister   . Cancer Sister   . Hypertension Mother   . Other Mother        amyloidosis     Current Outpatient Medications:  .  Cholecalciferol (VITAMIN D3) 250 MCG (10000 UT) capsule, , Disp: , Rfl:  .  Ginger, Zingiber officinalis, (GINGER ROOT) 550 MG  CAPS, Take 1 capsule by mouth daily., Disp: , Rfl:  .  Magnesium 500 MG CAPS, , Disp: , Rfl:  .  Omega-3 1000 MG CAPS, , Disp: , Rfl:  .  telmisartan (MICARDIS) 20 MG tablet, Take 1 tablet (20 mg total) by mouth daily., Disp: 90 tablet, Rfl: 1 .  Turmeric Curcumin 500 MG CAPS, , Disp: , Rfl:  .  Wheat Dextrin (BENEFIBER ON THE GO) PACK, Take 2 packets by mouth 2 (two) times daily. , Disp: , Rfl:    No Known Allergies   Review of Systems  Constitutional: Negative.   Eyes: Negative for blurred vision.  Respiratory: Negative.  Negative for shortness of breath.   Cardiovascular: Negative.  Negative for chest pain and palpitations.  Gastrointestinal: Negative.   Psychiatric/Behavioral: Negative.   All other systems reviewed and are negative.    Today's Vitals   09/20/20 1204  BP: 132/76  Pulse: 86  Temp: 98.4 F (36.9 C)  TempSrc: Oral  Weight: 134 lb (60.8 kg)  Height: 4' 11.75" (1.518 m)  PainSc: 0-No pain   Body mass index is 26.39 kg/m.  Wt Readings from Last 3 Encounters:  09/20/20 134 lb (60.8 kg)  07/28/20 136 lb (61.7 kg)  06/15/20 135 lb 9.6 oz (61.5 kg)   Objective:  Physical Exam Vitals and nursing note reviewed.  Constitutional:      Appearance: Normal appearance.  HENT:     Head: Normocephalic and atraumatic.  Cardiovascular:     Rate and Rhythm: Normal rate and regular rhythm.     Heart sounds: Normal heart sounds.  Pulmonary:     Breath sounds: Normal breath sounds.  Skin:    General: Skin is warm.  Neurological:     General: No focal deficit present.     Mental Status: She is alert and oriented to person, place, and time.         Assessment And Plan:     1. Hypertensive nephropathy Comments: Chronic, controlled. I did review her BP log as well. She will continue with current meds.   2. Chronic renal disease, stage II Comments: Chronic, this has been stable.   3. Estrogen deficiency Comments: Referral for bone density was completed. She is  encouraged to continue with her regular walking regimen and to c/w calcium/vitamin D supplementation.  - DG Bone Density; Future  4. Screen for colon cancer Comments: Her last colonoscopy was in 2012. She would like to schedule her initial consultation now. I will refer her to GI for CRC screening. - Ambulatory referral to Gastroenterology     Patient was given opportunity to ask questions. Patient verbalized understanding of the plan and was able to repeat key elements of the plan. All questions were answered to their satisfaction.  [name], MD   I, [name], MD, have reviewed all documentation for this visit. The documentation on 09/30/20 for the exam, diagnosis, procedures, and orders are all accurate and complete.  THE PATIENT IS ENCOURAGED TO PRACTICE SOCIAL DISTANCING DUE TO THE COVID-19 PANDEMIC.

## 2020-10-24 ENCOUNTER — Ambulatory Visit (INDEPENDENT_AMBULATORY_CARE_PROVIDER_SITE_OTHER): Payer: Medicare PPO | Admitting: Internal Medicine

## 2020-10-24 ENCOUNTER — Encounter: Payer: Self-pay | Admitting: Internal Medicine

## 2020-10-24 ENCOUNTER — Other Ambulatory Visit: Payer: Self-pay

## 2020-10-24 VITALS — BP 136/78 | HR 88 | Temp 98.2°F | Ht 59.0 in | Wt 133.8 lb

## 2020-10-24 DIAGNOSIS — E559 Vitamin D deficiency, unspecified: Secondary | ICD-10-CM | POA: Diagnosis not present

## 2020-10-24 DIAGNOSIS — Z6827 Body mass index (BMI) 27.0-27.9, adult: Secondary | ICD-10-CM | POA: Diagnosis not present

## 2020-10-24 DIAGNOSIS — N182 Chronic kidney disease, stage 2 (mild): Secondary | ICD-10-CM | POA: Diagnosis not present

## 2020-10-24 DIAGNOSIS — E663 Overweight: Secondary | ICD-10-CM

## 2020-10-24 DIAGNOSIS — Z1211 Encounter for screening for malignant neoplasm of colon: Secondary | ICD-10-CM

## 2020-10-24 DIAGNOSIS — Z0000 Encounter for general adult medical examination without abnormal findings: Secondary | ICD-10-CM | POA: Diagnosis not present

## 2020-10-24 DIAGNOSIS — I129 Hypertensive chronic kidney disease with stage 1 through stage 4 chronic kidney disease, or unspecified chronic kidney disease: Secondary | ICD-10-CM | POA: Diagnosis not present

## 2020-10-24 NOTE — Progress Notes (Signed)
This visit occurred during the SARS-CoV-2 public health emergency.  Safety protocols were in place, including screening questions prior to the visit, additional usage of staff PPE, and extensive cleaning of exam room while observing appropriate contact time as indicated for disinfecting solutions.  Subjective:     Patient ID: Patricia Soto , female    DOB: December 28, 1945 , 74 y.o.   MRN: [id number]   Chief Complaint  Patient presents with  . Annual Exam  . Hypertension    HPI  She is here today for a full physical examination. She is followed by GYN for pelvic exams. She has no specific concerns or complaints at this time.   Hypertension This is a chronic problem. The current episode started more than 1 year ago. The problem has been gradually improving since onset. The problem is controlled. Pertinent negatives include no blurred vision, chest pain, palpitations or shortness of breath. Risk factors for coronary artery disease include post-menopausal state. The current treatment provides moderate improvement. There are no compliance problems.  Hypertensive end-organ damage includes kidney disease.     Past Medical History:  Diagnosis Date  . Anemia    hx teen  . Angioedema 05/21/2019  . Arthritis   . Heart murmur    hx  . Hypertension      Family History  Problem Relation Age of Onset  . Diabetes Father   . Congenital heart disease Father   . Hypertension Father   . Heart attack Father   . Hypertension Sister   . Hypertension Sister   . Cancer Sister   . Hypertension Mother   . Other Mother        amyloidosis     Current Outpatient Medications:  .  Cholecalciferol (VITAMIN D3) 250 MCG (10000 UT) capsule, , Disp: , Rfl:  .  Ginger, Zingiber officinalis, (GINGER ROOT) 550 MG CAPS, Take 1 capsule by mouth daily., Disp: , Rfl:  .  Magnesium 500 MG CAPS, , Disp: , Rfl:  .  Omega-3 1000 MG CAPS, , Disp: , Rfl:  .  telmisartan (MICARDIS) 20 MG tablet, Take 1 tablet (20 mg  total) by mouth daily., Disp: 90 tablet, Rfl: 1 .  Turmeric Curcumin 500 MG CAPS, , Disp: , Rfl:  .  Wheat Dextrin (BENEFIBER ON THE GO) PACK, Take 2 packets by mouth 2 (two) times daily. , Disp: , Rfl:    No Known Allergies   The patient states she uses none for birth control. Last LMP was No LMP recorded. Patient is postmenopausal.. Negative for Dysmenorrhea. Negative for: breast discharge, breast lump(s), breast pain and breast self exam. Associated symptoms include abnormal vaginal bleeding. Pertinent negatives include abnormal bleeding (hematology), anxiety, decreased libido, depression, difficulty falling sleep, dyspareunia, history of infertility, nocturia, sexual dysfunction, sleep disturbances, urinary incontinence, urinary urgency, vaginal discharge and vaginal itching. Diet regular.The patient states her exercise level is  moderate.  . The patient's tobacco use is:  Social History   Tobacco Use  Smoking Status Never Smoker  Smokeless Tobacco Never Used  . She has been exposed to passive smoke. The patient's alcohol use is:  Social History   Substance and Sexual Activity  Alcohol Use Yes  . Alcohol/week: 3.0 - 4.0 standard drinks  . Types: 3 - 4 Glasses of wine per week   Comment: WINE -    Review of Systems  Constitutional: Negative.   HENT: Negative.   Eyes: Negative.  Negative for blurred vision.  Respiratory: Negative.  Negative  for shortness of breath.   Cardiovascular: Negative.  Negative for chest pain and palpitations.  Gastrointestinal: Negative.   Endocrine: Negative.   Genitourinary: Negative.   Musculoskeletal: Negative.   Skin: Negative.   Allergic/Immunologic: Negative.   Neurological: Negative.   Hematological: Negative.   Psychiatric/Behavioral: Negative.      Today's Vitals   10/24/20 1428  BP: 136/78  Pulse: 88  Temp: 98.2 F (36.8 C)  TempSrc: Oral  Weight: 133 lb 12.8 oz (60.7 kg)  Height: _0  (1.499 m)  PainSc: 1   PainLoc: Hip    Body mass index is 27.02 kg/m.  Wt Readings from Last 3 Encounters:  10/24/20 133 lb 12.8 oz (60.7 kg)  09/20/20 134 lb (60.8 kg)  07/28/20 136 lb (61.7 kg)   Objective:  Physical Exam Vitals and nursing note reviewed.  Constitutional:      General: She is not in acute distress.    Appearance: Normal appearance. She is well-developed.  HENT:     Head: Normocephalic and atraumatic.     Right Ear: Hearing, tympanic membrane, ear canal and external ear normal. There is no impacted cerumen.     Left Ear: Hearing, tympanic membrane, ear canal and external ear normal. There is no impacted cerumen.     Nose:     Comments: Deferred, masked    Mouth/Throat:     Comments: Deferred, masked Eyes:     General: Lids are normal.     Extraocular Movements: Extraocular movements intact.     Conjunctiva/sclera: Conjunctivae normal.  Neck:     Thyroid: No thyroid mass.     Vascular: No carotid bruit.  Cardiovascular:     Rate and Rhythm: Normal rate and regular rhythm.     Pulses: Normal pulses.     Heart sounds: Normal heart sounds. No murmur heard.   Pulmonary:     Effort: Pulmonary effort is normal.     Breath sounds: Normal breath sounds.  Chest:  Breasts:     Tanner Score is 5.     Right: Normal.     Left: Normal.    Abdominal:     General: Abdomen is flat. Bowel sounds are normal. There is no distension.     Palpations: Abdomen is soft.     Tenderness: There is no abdominal tenderness.  Genitourinary:    Comments: Deferred Musculoskeletal:        General: No swelling. Normal range of motion.     Cervical back: Full passive range of motion without pain, normal range of motion and neck supple.     Right lower leg: No edema.     Left lower leg: No edema.  Skin:    General: Skin is warm and dry.     Capillary Refill: Capillary refill takes less than 2 seconds.  Neurological:     General: No focal deficit present.     Mental Status: She is alert and oriented to person,  place, and time.     Cranial Nerves: No cranial nerve deficit.     Sensory: No sensory deficit.  Psychiatric:        Mood and Affect: Mood normal.        Behavior: Behavior normal.        Thought Content: Thought content normal.        Judgment: Judgment normal.         Assessment And Plan:     1. Routine general medical examination at health care facility Comments:  A full exam was performed. Importance of monthly self breast exams was discussed with the patient. PATIENT IS ADVISED TO GET 30-45 MINUTES REGULAR EXERCISE NO LESS THAN FOUR TO FIVE DAYS PER WEEK - BOTH WEIGHTBEARING EXERCISES AND AEROBIC ARE RECOMMENDED.  PATIENT IS ADVISED TO FOLLOW A HEALTHY DIET WITH AT LEAST SIX FRUITS/VEGGIES PER DAY, DECREASE INTAKE OF RED MEAT, AND TO INCREASE FISH INTAKE TO TWO DAYS PER WEEK.  MEATS/FISH SHOULD NOT BE FRIED, BAKED OR BROILED IS PREFERABLE.  I SUGGEST WEARING SPF 50 SUNSCREEN ON EXPOSED PARTS AND ESPECIALLY WHEN IN THE DIRECT SUNLIGHT FOR AN EXTENDED PERIOD OF TIME.  PLEASE AVOID FAST FOOD RESTAURANTS AND INCREASE YOUR WATER INTAKE.  2. Hypertensive nephropathy Comments: Chronic, fair control. She is aware optimal BP is less than 130/80. I did review home readings, over 90% of readings are at goal. She will c/w current meds. EKG performed, NSR w/o acute changes. She will f/u in six months for re-evaluation.  - EKG 12-Lead  - CMP14+EGFR - Lipid panel  3. Chronic renal disease, stage II Comments: Chronic, this has been stable. I will check renal function today. Encouraged to stay well hydrated as well.   4. Vitamin D deficiency disease Comments: I will check vitamin D level and supplement as needed.  - Vitamin D (25 hydroxy)  5. Overweight with body mass index (BMI) of 27 to 27.9 in adult Comments: Her BMI is acceptable for her demographic. Encouraged to c/w her regular walking regimen, she exercises over 150 minutes per week.   6. Screen for colon cancer I will refer her to GI for  CRC screening.    Patient was given opportunity to ask questions. Patient verbalized understanding of the plan and was able to repeat key elements of the plan. All questions were answered to their satisfaction.  Maximino Greenland, MD   I, Maximino Greenland, MD, have reviewed all documentation for this visit. The documentation on 11/05/20 for the exam, diagnosis, procedures, and orders are all accurate and complete.  THE PATIENT IS ENCOURAGED TO PRACTICE SOCIAL DISTANCING DUE TO THE COVID-19 PANDEMIC.

## 2020-10-24 NOTE — Patient Instructions (Signed)
Health Maintenance After Age 74 After age 74, you are at a higher risk for certain long-term diseases and infections as well as injuries from falls. Falls are a major cause of broken bones and head injuries in people who are older than age 74. Getting regular preventive care can help to keep you healthy and well. Preventive care includes getting regular testing and making lifestyle changes as recommended by your health care provider. Talk with your health care provider about:  Which screenings and tests you should have. A screening is a test that checks for a disease when you have no symptoms.  A diet and exercise plan that is right for you. What should I know about screenings and tests to prevent falls? Screening and testing are the best ways to find a health problem early. Early diagnosis and treatment give you the best chance of managing medical conditions that are common after age 74. Certain conditions and lifestyle choices may make you more likely to have a fall. Your health care provider may recommend:  Regular vision checks. Poor vision and conditions such as cataracts can make you more likely to have a fall. If you wear glasses, make sure to get your prescription updated if your vision changes.  Medicine review. Work with your health care provider to regularly review all of the medicines you are taking, including over-the-counter medicines. Ask your health care provider about any side effects that may make you more likely to have a fall. Tell your health care provider if any medicines that you take make you feel dizzy or sleepy.  Osteoporosis screening. Osteoporosis is a condition that causes the bones to get weaker. This can make the bones weak and cause them to break more easily.  Blood pressure screening. Blood pressure changes and medicines to control blood pressure can make you feel dizzy.  Strength and balance checks. Your health care provider may recommend certain tests to check your  strength and balance while standing, walking, or changing positions.  Foot health exam. Foot pain and numbness, as well as not wearing proper footwear, can make you more likely to have a fall.  Depression screening. You may be more likely to have a fall if you have a fear of falling, feel emotionally low, or feel unable to do activities that you used to do.  Alcohol use screening. Using too much alcohol can affect your balance and may make you more likely to have a fall. What actions can I take to lower my risk of falls? General instructions  Talk with your health care provider about your risks for falling. Tell your health care provider if: ? You fall. Be sure to tell your health care provider about all falls, even ones that seem minor. ? You feel dizzy, sleepy, or off-balance.  Take over-the-counter and prescription medicines only as told by your health care provider. These include any supplements.  Eat a healthy diet and maintain a healthy weight. A healthy diet includes low-fat dairy products, low-fat (lean) meats, and fiber from whole grains, beans, and lots of fruits and vegetables. Home safety  Remove any tripping hazards, such as rugs, cords, and clutter.  Install safety equipment such as grab bars in bathrooms and safety rails on stairs.  Keep rooms and walkways well-lit. Activity   Follow a regular exercise program to stay fit. This will help you maintain your balance. Ask your health care provider what types of exercise are appropriate for you.  If you need a cane or   walker, use it as recommended by your health care provider.  Wear supportive shoes that have nonskid soles. Lifestyle  Do not drink alcohol if your health care provider tells you not to drink.  If you drink alcohol, limit how much you have: ? 0-1 drink a day for women. ? 0-2 drinks a day for men.  Be aware of how much alcohol is in your drink. In the [place] one drink equals one typical bottle of beer (12  oz), one-half glass of wine (5 oz), or one shot of hard liquor (1 oz).  Do not use any products that contain nicotine or tobacco, such as cigarettes and e-cigarettes. If you need help quitting, ask your health care provider. Summary  Having a healthy lifestyle and getting preventive care can help to protect your health and wellness after age 74.  Screening and testing are the best way to find a health problem early and help you avoid having a fall. Early diagnosis and treatment give you the best chance for managing medical conditions that are more common for people who are older than age 74.  Falls are a major cause of broken bones and head injuries in people who are older than age 74. Take precautions to prevent a fall at home.  Work with your health care provider to learn what changes you can make to improve your health and wellness and to prevent falls. This information is not intended to replace advice given to you by your health care provider. Make sure you discuss any questions you have with your health care provider. Document Revised: 02/18/2019 Document Reviewed: 09/10/2017 Elsevier Patient Education  2020 [employer] Inc.  

## 2020-10-25 LAB — LIPID PANEL
Chol/HDL Ratio: 2.7 ratio (ref 0.0–4.4)
Cholesterol, Total: 216 mg/dL — ABNORMAL HIGH (ref 100–199)
HDL: 80 mg/dL (ref >39)
LDL Chol Calc (NIH): 125 mg/dL — ABNORMAL HIGH (ref 0–99)
Triglycerides: 64 mg/dL (ref 0–149)
VLDL Cholesterol Cal: 11 mg/dL (ref 5–40)

## 2020-10-25 LAB — CMP14+EGFR
ALT: 12 IU/L (ref 0–32)
AST: 19 IU/L (ref 0–40)
Albumin/Globulin Ratio: 2 (ref 1.2–2.2)
Albumin: 4.3 g/dL (ref 3.7–4.7)
Alkaline Phosphatase: 54 IU/L (ref 44–121)
BUN/Creatinine Ratio: 15 (ref 12–28)
BUN: 11 mg/dL (ref 8–27)
Bilirubin Total: 0.6 mg/dL (ref 0.0–1.2)
CO2: 24 mmol/L (ref 20–29)
Calcium: 9.3 mg/dL (ref 8.7–10.3)
Chloride: 106 mmol/L (ref 96–106)
Creatinine, Ser: 0.75 mg/dL (ref 0.57–1.00)
GFR calc Af Amer: 91 mL/min/{1.73_m2} (ref >59)
GFR calc non Af Amer: 79 mL/min/{1.73_m2} (ref >59)
Globulin, Total: 2.2 g/dL (ref 1.5–4.5)
Glucose: 84 mg/dL (ref 65–99)
Potassium: 3.3 mmol/L — ABNORMAL LOW (ref 3.5–5.2)
Sodium: 142 mmol/L (ref 134–144)
Total Protein: 6.5 g/dL (ref 6.0–8.5)

## 2020-10-25 LAB — VITAMIN D 25 HYDROXY (VIT D DEFICIENCY, FRACTURES): Vit D, 25-Hydroxy: 85.6 ng/mL (ref 30.0–100.0)

## 2020-10-26 DIAGNOSIS — Z1211 Encounter for screening for malignant neoplasm of colon: Secondary | ICD-10-CM | POA: Diagnosis not present

## 2020-10-26 DIAGNOSIS — K5900 Constipation, unspecified: Secondary | ICD-10-CM | POA: Diagnosis not present

## 2020-10-30 ENCOUNTER — Encounter: Payer: Self-pay | Admitting: Internal Medicine

## 2020-11-05 ENCOUNTER — Encounter: Payer: Self-pay | Admitting: Internal Medicine

## 2020-11-05 DIAGNOSIS — E663 Overweight: Secondary | ICD-10-CM | POA: Insufficient documentation

## 2020-11-05 DIAGNOSIS — E559 Vitamin D deficiency, unspecified: Secondary | ICD-10-CM | POA: Insufficient documentation

## 2020-11-05 DIAGNOSIS — Z6829 Body mass index (BMI) 29.0-29.9, adult: Secondary | ICD-10-CM | POA: Insufficient documentation

## 2020-11-30 ENCOUNTER — Encounter: Payer: Self-pay | Admitting: Internal Medicine

## 2020-12-04 DIAGNOSIS — Z780 Asymptomatic menopausal state: Secondary | ICD-10-CM | POA: Diagnosis not present

## 2020-12-04 DIAGNOSIS — M85851 Other specified disorders of bone density and structure, right thigh: Secondary | ICD-10-CM | POA: Diagnosis not present

## 2020-12-04 LAB — HM DEXA SCAN

## 2020-12-11 ENCOUNTER — Encounter: Payer: Self-pay | Admitting: Internal Medicine

## 2020-12-11 ENCOUNTER — Telehealth: Payer: Self-pay

## 2020-12-11 NOTE — Telephone Encounter (Signed)
She can try 500mg  once daily M-F only for now

## 2020-12-11 NOTE — Telephone Encounter (Signed)
The pt was given her dexa results. The pt wanted to know how much calcium she should take because she stopped taking calcium after she had an issue with her parathyroid.  The pt was notified that I would ask DR Allyne Gee when she returns to the office.

## 2020-12-11 NOTE — Telephone Encounter (Signed)
I called the pt to let her know that Dr Allyne Gee said the pt has osteopenia and that the pt should continue with regular walking and continue with her calcium/vitamin d supplements.

## 2020-12-12 ENCOUNTER — Telehealth: Payer: Self-pay

## 2020-12-12 NOTE — Telephone Encounter (Signed)
I left the pt a message that Dr Allyne Gee said for the pt to take 500 mg Monday - Friday of calcium.

## 2020-12-18 ENCOUNTER — Encounter: Payer: Self-pay | Admitting: Internal Medicine

## 2020-12-27 ENCOUNTER — Encounter: Payer: Self-pay | Admitting: Anesthesiology

## 2021-01-06 IMAGING — US US ABDOMEN LIMITED
1 series · 14 of 25 positions shown · non-contrast
Comparison: Ultrasound of 11/18/2019 and CT scan of 01/02/2020

CLINICAL DATA: Hepatic cystic lesions seen on prior examinations.

EXAM:
ULTRASOUND ABDOMEN LIMITED RIGHT UPPER QUADRANT

[Series 1: us abdomen limited · 0.14mm/px · 14 of 44 slices shown]
[im 1/44]
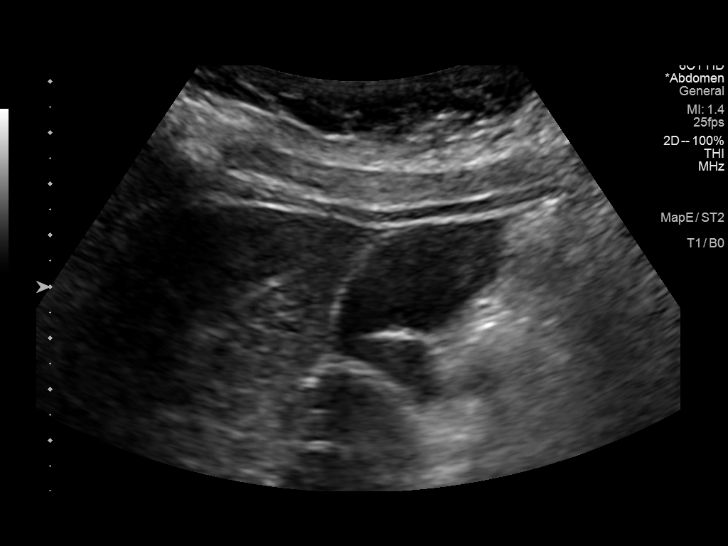
[im 4/44]
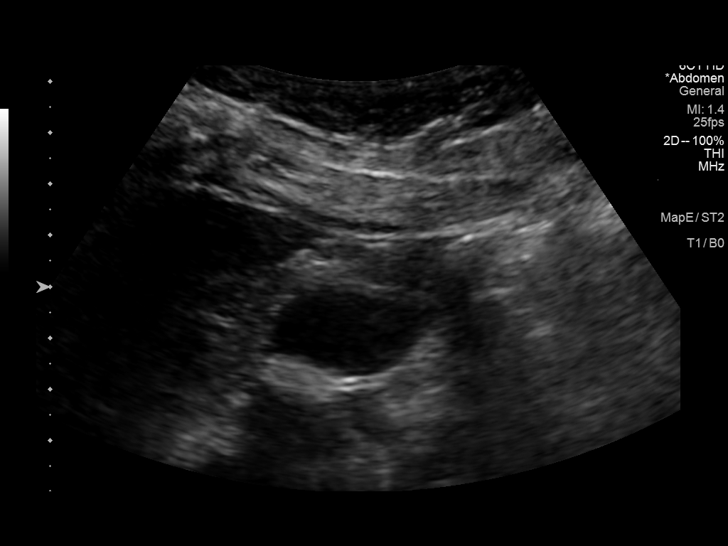
[im 8/44]
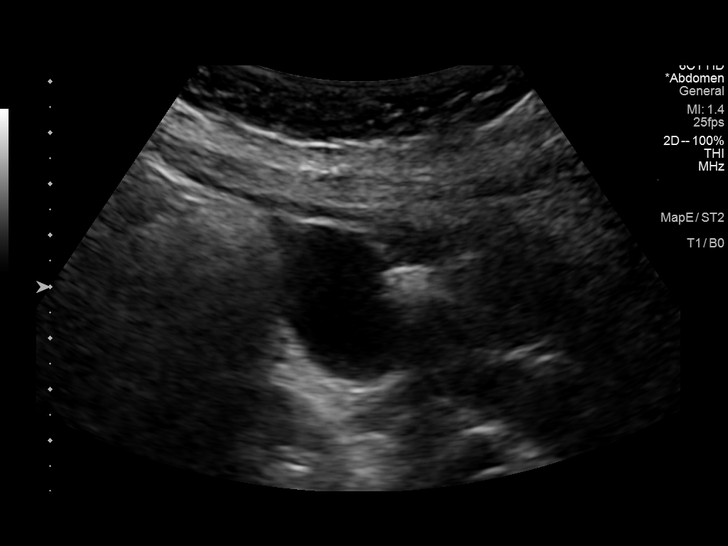
[im 11/44]
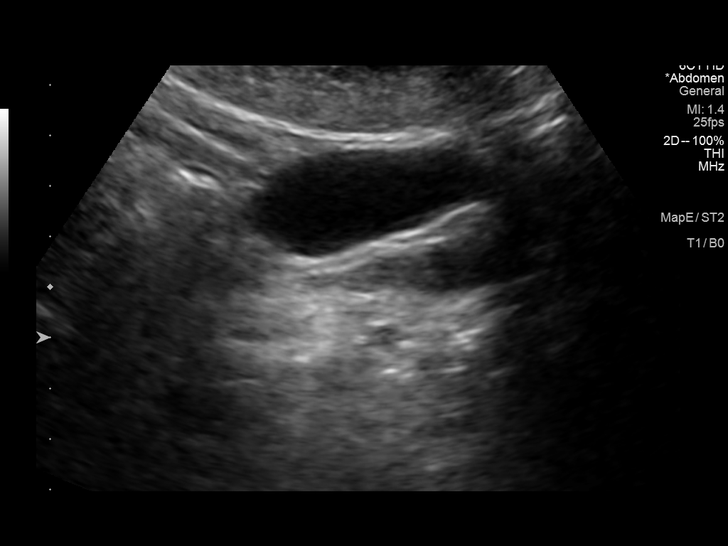
[im 15/44]
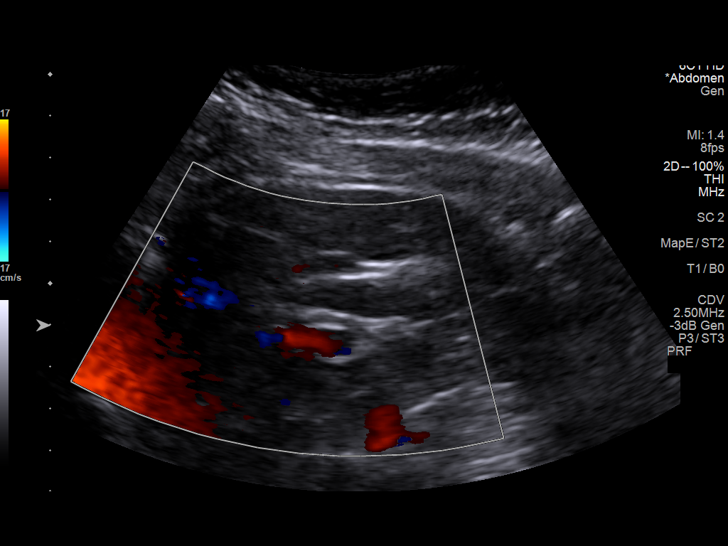
[im 17/44]
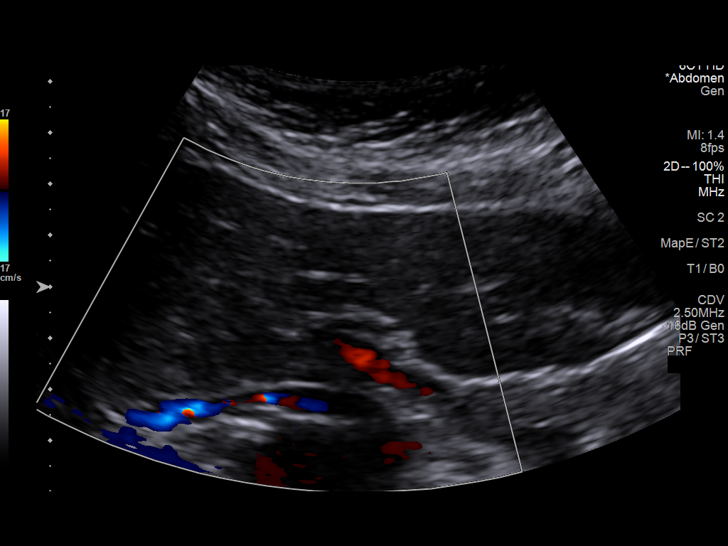
[im 20/44]
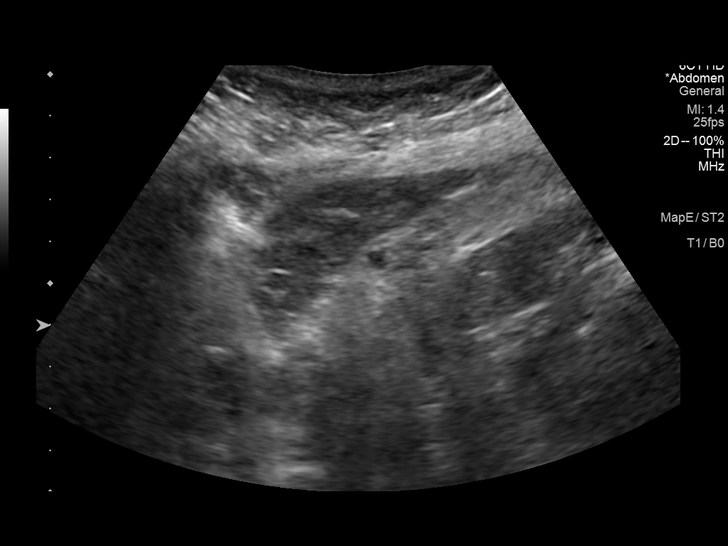
[im 24/44]
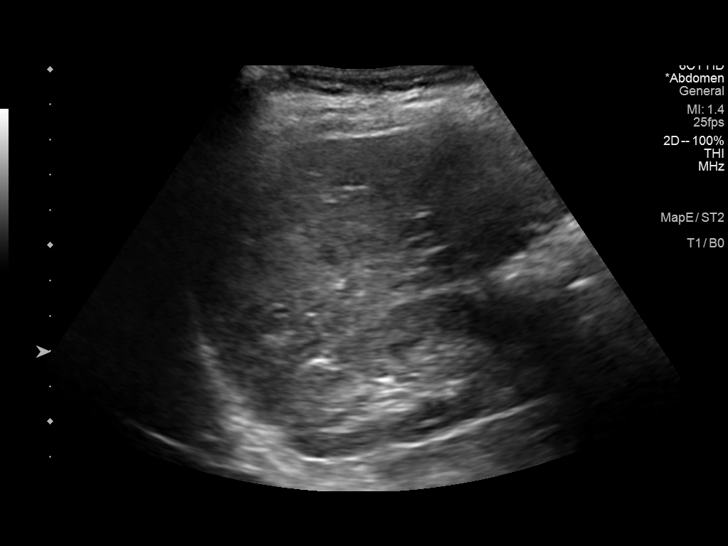
[im 27/44]
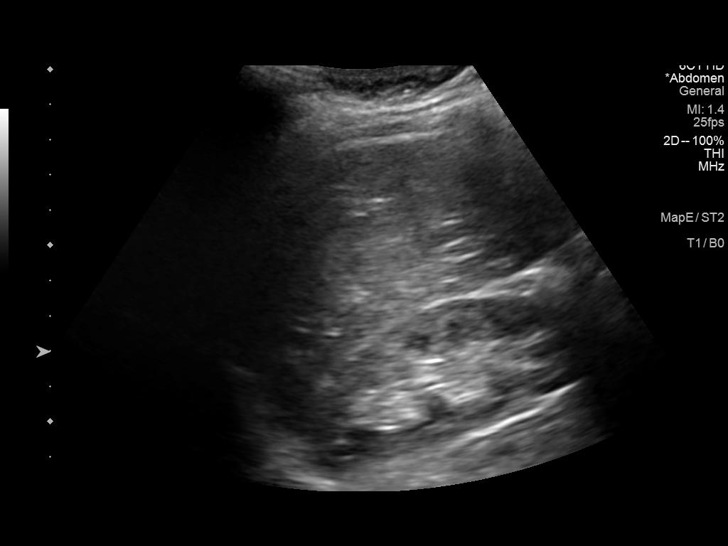
[im 29/44]
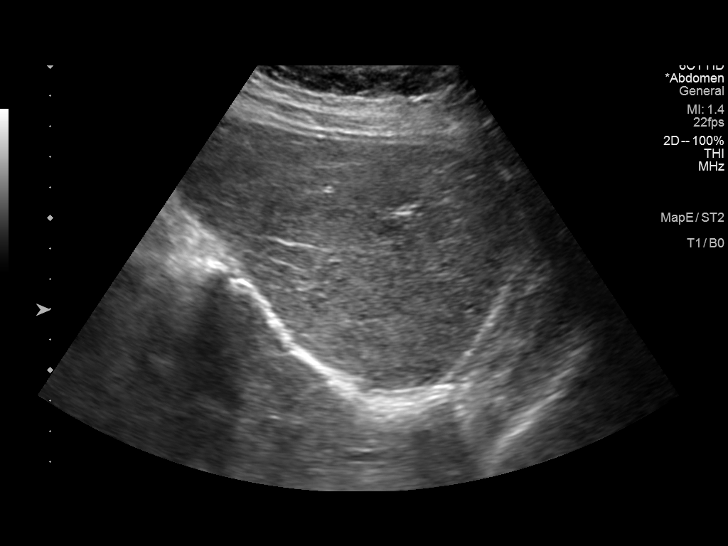
[im 33/44]
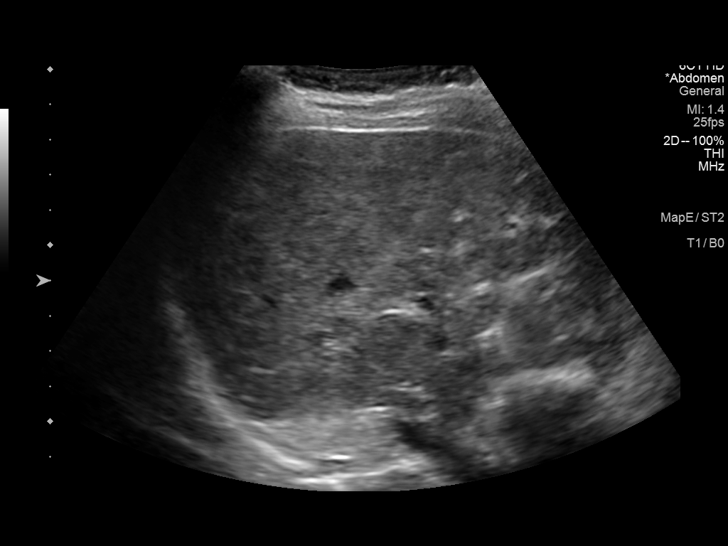
[im 36/44]
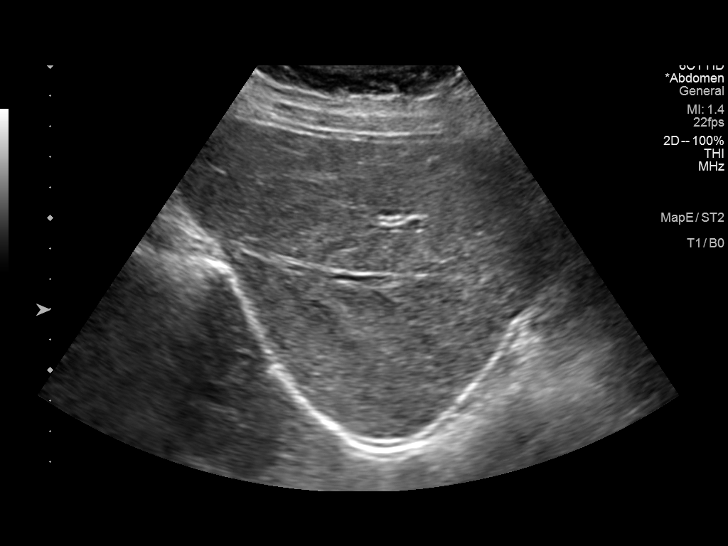
[im 40/44]
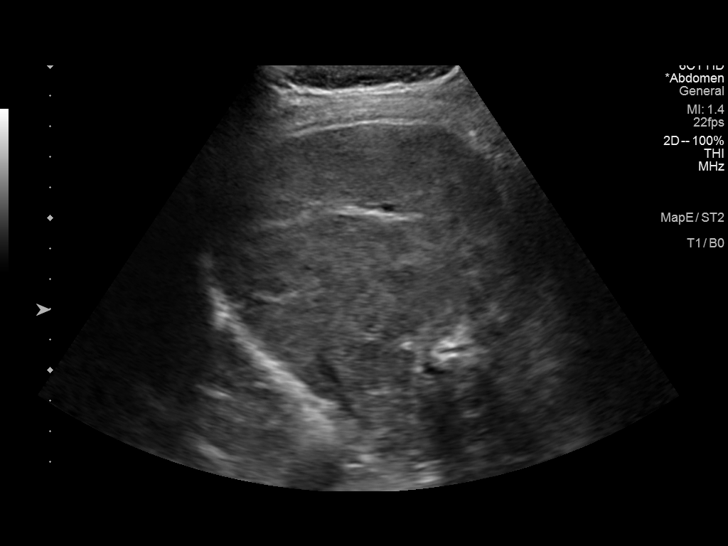
[im 44/44]
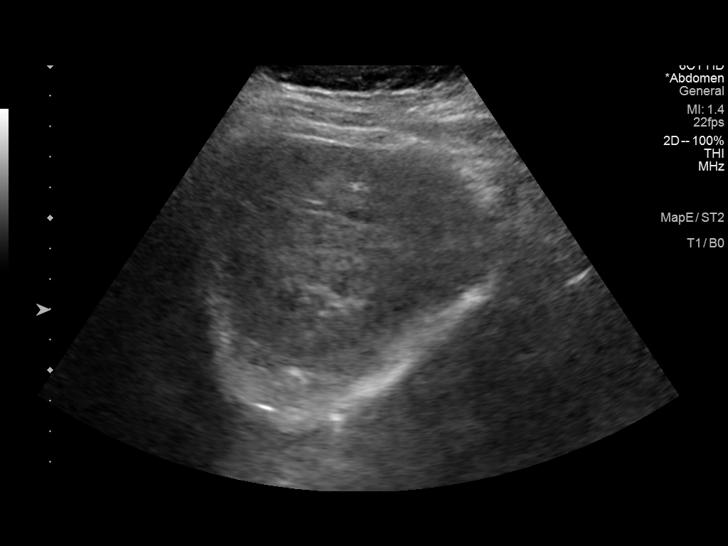

[14 of 25 positions shown; findings below may reference images not displayed]

FINDINGS: Gallbladder:

No gallstones or wall thickening visualized. No sonographic Murphy
sign noted by sonographer.

Common bile duct:

Diameter: 0.3 cm

Liver:

1.1 by 0.9 by 1.0 cm hypoechoic lesion with enhanced through
transmission in the right hepatic lobe, no significant change in
size compared to 01/03/2020, compatible with a cyst. Questionable
faint internal echoes raise the possibility of mild complexity.
Hepatic parenchymal heterogeneity is nonspecific but could reflect
steatosis. Portal vein is patent on color Doppler imaging with
normal direction of blood flow towards the liver.

Other: None.
IMPRESSION: 1. Stable size of small cyst of the right hepatic lobe since
01/03/2020.
[DATE]. Hepatic parenchymal heterogeneity is nonspecific but could
reflect steatosis.

## 2021-01-18 ENCOUNTER — Ambulatory Visit: Payer: Medicare PPO | Admitting: Internal Medicine

## 2021-01-19 ENCOUNTER — Other Ambulatory Visit: Payer: Medicare PPO

## 2021-01-19 DIAGNOSIS — Z20822 Contact with and (suspected) exposure to covid-19: Secondary | ICD-10-CM

## 2021-01-20 ENCOUNTER — Other Ambulatory Visit: Payer: Self-pay | Admitting: Internal Medicine

## 2021-01-20 LAB — SARS-COV-2, NAA 2 DAY TAT

## 2021-01-20 LAB — NOVEL CORONAVIRUS, NAA: SARS-CoV-2, NAA: NOT DETECTED

## 2021-04-23 ENCOUNTER — Other Ambulatory Visit: Payer: Self-pay | Admitting: Internal Medicine

## 2021-04-23 DIAGNOSIS — Z1231 Encounter for screening mammogram for malignant neoplasm of breast: Secondary | ICD-10-CM

## 2021-04-25 ENCOUNTER — Ambulatory Visit: Payer: Medicare PPO | Admitting: Internal Medicine

## 2021-04-25 ENCOUNTER — Other Ambulatory Visit: Payer: Self-pay

## 2021-04-25 ENCOUNTER — Ambulatory Visit (INDEPENDENT_AMBULATORY_CARE_PROVIDER_SITE_OTHER): Payer: Medicare PPO

## 2021-04-25 ENCOUNTER — Encounter: Payer: Self-pay | Admitting: Internal Medicine

## 2021-04-25 VITALS — BP 130/78 | HR 84 | Temp 98.2°F | Ht 59.2 in | Wt 136.2 lb

## 2021-04-25 DIAGNOSIS — Z0000 Encounter for general adult medical examination without abnormal findings: Secondary | ICD-10-CM

## 2021-04-25 DIAGNOSIS — Z1211 Encounter for screening for malignant neoplasm of colon: Secondary | ICD-10-CM | POA: Diagnosis not present

## 2021-04-25 DIAGNOSIS — N182 Chronic kidney disease, stage 2 (mild): Secondary | ICD-10-CM

## 2021-04-25 DIAGNOSIS — I129 Hypertensive chronic kidney disease with stage 1 through stage 4 chronic kidney disease, or unspecified chronic kidney disease: Secondary | ICD-10-CM

## 2021-04-25 DIAGNOSIS — Z23 Encounter for immunization: Secondary | ICD-10-CM | POA: Diagnosis not present

## 2021-04-25 MED ORDER — SHINGRIX 50 MCG/0.5ML IM SUSR: 0.5000 mL | Freq: Once | INTRAMUSCULAR | 0 refills | Status: AC

## 2021-04-25 NOTE — Progress Notes (Signed)
This visit occurred during the SARS-CoV-2 public health emergency.  Safety protocols were in place, including screening questions prior to the visit, additional usage of staff PPE, and extensive cleaning of exam room while observing appropriate contact time as indicated for disinfecting solutions.  Subjective:   Patricia Soto is a 75 y.o. female who presents for Medicare Annual (Subsequent) preventive examination.  Review of Systems     Cardiac Risk Factors include: advanced age (>4655men, 5>65 women);hypertension     Objective:    Today's Vitals   04/25/21 1411  BP: 130/78  Pulse: 84  Temp: 98.2 F (36.8 C)  TempSrc: Oral  SpO2: 98%  Weight: 136 lb 3.2 oz (61.8 kg)  Height: 4' 11.2" (1.504 m)   Body mass index is 27.32 kg/m.  Advanced Directives 04/25/2021 04/13/2020 06/17/2019 10/01/2018 10/30/2015 10/20/2015  Does Patient Have a Medical Advance Directive? Yes Yes Yes Yes - Yes  Type of Advance Directive Healthcare Power of [place]orney;Living will Healthcare Power of [place]orney;Living will Living will Living will - Living will;Healthcare Power of Attorney  Does patient want to make changes to medical advance directive? - - - No - Patient declined - No - Patient declined  Copy of Healthcare Power of Attorney in Chart? No - copy requested No - copy requested - - No - copy requested No - copy requested    Current Medications (verified) Outpatient Encounter Medications as of 04/25/2021  Medication Sig   Calcium Carbonate (CALCIUM 500 PO) Take 1 tablet by mouth daily. Takes 5 days a week   Cholecalciferol (VITAMIN D3) 250 MCG (10000 UT) capsule    Ginger, Zingiber officinalis, (GINGER ROOT) 550 MG CAPS Take 1 capsule by mouth daily.   Magnesium 500 MG CAPS    Omega-3 1000 MG CAPS    telmisartan (MICARDIS) 20 MG tablet TAKE 1 TABLET BY MOUTH EVERY DAY   Turmeric Curcumin 500 MG CAPS    Wheat Dextrin (BENEFIBER ON THE GO) PACK Take 2 packets by mouth 2 (two) times daily.    Zoster Vaccine  Adjuvanted [facility](SHINGRIX) injection Inject 0.5 mLs into the muscle once for 1 dose.   No facility-administered encounter medications on file as of 04/25/2021.    Allergies (verified) Patient has no known allergies.   History: Past Medical History:  Diagnosis Date   Anemia    hx teen   Angioedema 05/21/2019   Arthritis    Heart murmur    hx   Hypertension    Past Surgical History:  Procedure Laterality Date   COLPOSCOPY  1991   FOOT SURGERY Bilateral    bunions93,2013 rt,94 ,2012,lft toes2008lft   MYOMECTOMY  89   PARATHYROIDECTOMY N/A 10/30/2015   Procedure: PARATHYROIDECTOMY;  Surgeon: Darnell Levelodd Gerkin, MD;  [facility];  Service: General;  Laterality: N/A;   Family History  Problem Relation Age of Onset   Diabetes Father    Congenital heart disease Father    Hypertension Father    Heart attack Father    Hypertension Sister    Hypertension Sister    Cancer Sister    Hypertension Mother    Other Mother        amyloidosis   Social History   Socioeconomic History   Marital status: Single    Spouse name: Not on file   Number of children: Not on file   Years of education: Not on file   Highest education level: Not on file  Occupational History   Occupation: retired  Tobacco Use  Smoking status: Never   Smokeless tobacco: Never  Vaping Use   Vaping Use: Never used  Substance and Sexual Activity   Alcohol use: Yes    Alcohol/week: 3.0 - 4.0 standard drinks    Types: 3 - 4 Glasses of wine per week    Comment: WINE -    Drug use: No   Sexual activity: Not Currently    Partners: Male    Comment: 1ST  intercourse- 26, partners - refused to answer   Other Topics Concern   Not on file  Social History Narrative   Not on file   Social Determinants of Health   Financial Resource Strain: Low Risk    Difficulty of Paying Living Expenses: Not hard at all  Food Insecurity: No Food Insecurity   Worried About Programme researcher, broadcasting/film/video in the Last Year: Never true   Garment/textile technologist in the Last Year: Never true  Transportation Needs: No Transportation Needs   Lack of Transportation (Medical): No   Lack of Transportation (Non-Medical): No  Physical Activity: Sufficiently Active   Days of Exercise per Week: 5 days   Minutes of Exercise per Session: 60 min  Stress: No Stress Concern Present   Feeling of Stress : Not at all  Social Connections: Not on file    Tobacco Counseling Counseling given: Not Answered   Clinical Intake:  Pre-visit preparation completed: Yes  Pain : No/denies pain     Nutritional Status: BMI 25 -29 Overweight Nutritional Risks: None  How often do you need to have someone help you when you read instructions, pamphlets, or other written materials from your doctor or pharmacy?: 1 - Never What is the last grade level you completed in school?: doctorate  Diabetic? no  Interpreter Needed?: No  Information entered by :: NAllen LPN   Activities of Daily Living In your present state of health, do you have any difficulty performing the following activities: 04/25/2021  Hearing? N  Vision? N  Difficulty concentrating or making decisions? N  Walking or climbing stairs? N  Dressing or bathing? N  Doing errands, shopping? N  Preparing Food and eating ? N  Using the Toilet? N  In the past six months, have you accidently leaked urine? Y  Do you have problems with loss of bowel control? N  Managing your Medications? N  Managing your Finances? N  Housekeeping or managing your Housekeeping? N  Some recent data might be hidden    Patient Care Team: Dorothyann Peng, MD as PCP - General (Internal Medicine)  Indicate any recent Medical Services you may have received from other than [facility] providers in the past year (date may be approximate).     Assessment:   This is a routine wellness examination for Patricia Soto.  Hearing/Vision screen Vision Screening - Comments:: Regular eye exams, Dr. Alben Spittle  Dietary issues and exercise activities  discussed: Current Exercise Habits: Home exercise routine, Type of exercise: yoga;walking, Time (Minutes): 60, Frequency (Times/Week): 5, Weekly Exercise (Minutes/Week): 300   Goals Addressed             This Visit's Progress    Patient Stated       04/25/2021, wants to regulate sleep and lose 5 pounds        Depression Screen PHQ 2/9 Scores 04/25/2021 04/13/2020 06/17/2019 03/31/2019 10/01/2018  PHQ - 2 Score 0 0 0 0 0  PHQ- 9 Score - - 0 - 1    Fall Risk Fall Risk  04/25/2021 04/13/2020 10/14/2019 07/02/2019 06/17/2019  Falls in the past year? 0 0 0 0 0  Number falls in past yr: - - - 0 -  Injury with Fall? - - - 0 -  Risk for fall due to : Medication side effect Medication side effect - - Medication side effect  Follow up Falls evaluation completed;Education provided;Falls prevention discussed Falls evaluation completed;Education provided;Falls prevention discussed - - Falls evaluation completed;Education provided;Falls prevention discussed    FALL RISK PREVENTION PERTAINING TO THE HOME:  Any stairs in or around the home? Yes  If so, are there any without handrails? No  Home free of loose throw rugs in walkways, pet beds, electrical cords, etc? Yes  Adequate lighting in your home to reduce risk of falls? Yes   ASSISTIVE DEVICES UTILIZED TO PREVENT FALLS:  Life alert? No  Use of a cane, walker or w/c? No  Grab bars in the bathroom? Yes  Shower chair or bench in shower? No  Elevated toilet seat or a handicapped toilet? Yes   TIMED UP AND GO:  Was the test performed? No .     Gait steady and fast without use of assistive device  Cognitive Function:     6CIT Screen 04/25/2021 04/13/2020 06/17/2019 10/01/2018  What Year? 0 points 0 points 0 points 0 points  What month? 0 points 0 points 0 points 0 points  What time? 0 points 0 points 0 points 0 points  Count back from 20 0 points 0 points 0 points 0 points  Months in reverse 0 points 2 points 0 points 0 points  Repeat phrase  0 points 0 points 0 points 0 points  Total Score 0 2 0 0    Immunizations Immunization History  Administered Date(s) Administered   Influenza, High Dose Seasonal PF 08/29/2018, 08/23/2019, 07/26/2020, 07/27/2020   Influenza,inj,Quad PF,6+ Mos 07/27/2020   Influenza-Unspecified 08/23/2019   Moderna Sars-Covid-2 Vaccination 01/03/2020, 02/01/2020, 09/14/2020   Pneumococcal Conjugate-13 06/08/2015   Pneumococcal Polysaccharide-23 04/29/2014   Tdap 11/20/2009, 04/27/2020   Zoster, Live 05/24/2014    TDAP status: Up to date  Flu Vaccine status: Up to date  Pneumococcal vaccine status: Up to date  Covid-19 vaccine status: Completed vaccines  Qualifies for Shingles Vaccine? Yes   Zostavax completed Yes   Shingrix Completed?: No.    Education has been provided regarding the importance of this vaccine. Patient has been advised to call insurance company to determine out of pocket expense if they have not yet received this vaccine. Advised may also receive vaccine at local pharmacy or Health Dept. Verbalized acceptance and understanding.  Screening Tests Health Maintenance  Topic Date Due   Zoster Vaccines- Shingrix (1 of 2) Never done   COVID-19 Vaccine (4 - Booster for Moderna series) 01/12/2021   COLONOSCOPY (Pts 45-15yrs Insurance coverage will need to be confirmed)  03/04/2021   INFLUENZA VACCINE  06/11/2021   MAMMOGRAM  06/01/2022   TETANUS/TDAP  04/27/2030   DEXA SCAN  Completed   Hepatitis C Screening  Completed   PNA vac Low Risk Adult  Completed   HPV VACCINES  Aged Out    Health Maintenance  Health Maintenance Due  Topic Date Due   Zoster Vaccines- Shingrix (1 of 2) Never done   COVID-19 Vaccine (4 - Booster for Moderna series) 01/12/2021   COLONOSCOPY (Pts 45-22yrs Insurance coverage will need to be confirmed)  03/04/2021   Colorectal cancer screening: scheduled in July  Mammogram status: Completed 06/01/2020. Repeat every year  Bone Density status: Completed  12/27/2020.  Lung Cancer Screening: (Low Dose CT Chest recommended if Age 28-80 years, 30 pack-year currently smoking OR have quit w/in 15years.) does not qualify.   Lung Cancer Screening Referral: no  Additional Screening:  Hepatitis C Screening: does qualify; Completed 04/08/2019  Vision Screening: Recommended annual ophthalmology exams for early detection of glaucoma and other disorders of the eye. Is the patient up to date with their annual eye exam?  No  Who is the provider or what is the name of the office in which the patient attends annual eye exams? Dr. Alben Spittle If pt is not established with a provider, would they like to be referred to a provider to establish care? No .   Dental Screening: Recommended annual dental exams for proper oral hygiene  Community Resource Referral / Chronic Care Management: CRR required this visit?  No   CCM required this visit?  No      Plan:     I have personally reviewed and noted the following in the patient's chart:   Medical and social history Use of alcohol, tobacco or illicit drugs  Current medications and supplements including opioid prescriptions.  Functional ability and status Nutritional status Physical activity Advanced directives List of other physicians Hospitalizations, surgeries, and ER visits in previous 12 months Vitals Screenings to include cognitive, depression, and falls Referrals and appointments  In addition, I have reviewed and discussed with patient certain preventive protocols, quality metrics, and best practice recommendations. A written personalized care plan for preventive services as well as general preventive health recommendations were provided to patient.     Barb Merino, LPN   3/72/9021   Nurse Notes:

## 2021-04-25 NOTE — Progress Notes (Signed)
I,Katawbba Wiggins,acting as a Education administrator for Maximino Greenland, MD.,have documented all relevant documentation on the behalf of Maximino Greenland, MD,as directed by  Maximino Greenland, MD while in the presence of Maximino Greenland, MD.  This visit occurred during the SARS-CoV-2 public health emergency.  Safety protocols were in place, including screening questions prior to the visit, additional usage of staff PPE, and extensive cleaning of exam room while observing appropriate contact time as indicated for disinfecting solutions.  Subjective:     Patient ID: Patricia Soto , female    DOB: 05-13-1946 , 75 y.o.   MRN: [id number]   Chief Complaint  Patient presents with   Hypertension    HPI  She presents today for htn f/u. She reports compliance with meds. She denies headaches, chest pain and shortness of breath.  She is also scheduled for AWV with [facility] Advisor.   Hypertension This is a chronic problem. The current episode started more than 1 year ago. The problem has been gradually improving since onset. The problem is controlled. Pertinent negatives include no blurred vision, chest pain, palpitations or shortness of breath. Past treatments include angiotensin blockers and diuretics. The current treatment provides moderate improvement. There are no compliance problems.     Past Medical History:  Diagnosis Date   Anemia    hx teen   Angioedema 05/21/2019   Arthritis    Heart murmur    hx   Hypertension      Family History  Problem Relation Age of Onset   Diabetes Father    Congenital heart disease Father    Hypertension Father    Heart attack Father    Hypertension Sister    Hypertension Sister    Cancer Sister    Hypertension Mother    Other Mother        amyloidosis     Current Outpatient Medications:    Calcium Carbonate (CALCIUM 500 PO), Take 1 tablet by mouth daily. Takes 5 days a week, Disp: , Rfl:    Cholecalciferol (VITAMIN D3) 250 MCG (10000 UT) capsule, , Disp: , Rfl:     Ginger, Zingiber officinalis, (GINGER ROOT) 550 MG CAPS, Take 1 capsule by mouth daily., Disp: , Rfl:    Magnesium 500 MG CAPS, , Disp: , Rfl:    Omega-3 1000 MG CAPS, , Disp: , Rfl:    telmisartan (MICARDIS) 20 MG tablet, TAKE 1 TABLET BY MOUTH EVERY DAY, Disp: 90 tablet, Rfl: 1   Turmeric Curcumin 500 MG CAPS, , Disp: , Rfl:    Wheat Dextrin (BENEFIBER ON THE GO) PACK, Take 2 packets by mouth 2 (two) times daily. , Disp: , Rfl:    No Known Allergies   Review of Systems  Constitutional: Negative.   Eyes:  Negative for blurred vision.  Respiratory: Negative.  Negative for shortness of breath.   Cardiovascular: Negative.  Negative for chest pain and palpitations.  Gastrointestinal: Negative.   Psychiatric/Behavioral: Negative.    All other systems reviewed and are negative.   Today's Vitals   04/25/21 1437  BP: 130/78  Pulse: 84  Temp: 98.2 F (36.8 C)  TempSrc: Oral  Weight: 136 lb 3.2 oz (61.8 kg)  Height: 4' 11.2" (1.504 m)   Body mass index is 27.32 kg/m.  Wt Readings from Last 3 Encounters:  04/25/21 136 lb 3.2 oz (61.8 kg)  04/25/21 136 lb 3.2 oz (61.8 kg)  10/24/20 133 lb 12.8 oz (60.7 kg)    BP Readings from  Last 3 Encounters:  04/25/21 130/78  04/25/21 130/78  10/24/20 136/78    Objective:  Physical Exam Vitals and nursing note reviewed.  Constitutional:      Appearance: Normal appearance.  HENT:     Head: Normocephalic and atraumatic.     Nose:     Comments: Masked     Mouth/Throat:     Comments: Masked  Cardiovascular:     Rate and Rhythm: Normal rate and regular rhythm.     Heart sounds: Normal heart sounds.  Pulmonary:     Effort: Pulmonary effort is normal.     Breath sounds: Normal breath sounds.  Skin:    General: Skin is warm.  Neurological:     General: No focal deficit present.     Mental Status: She is alert.  Psychiatric:        Mood and Affect: Mood normal.        Behavior: Behavior normal.        Assessment And Plan:     1.  Hypertensive nephropathy Comments: Chronic, controlled.I will not make any changes today. Encouraged to follow low sodium diet.  - CMP14+EGFR - Lipid panel  2. Chronic renal disease, stage II Comments: Chronic, I will check renal function today. Encouraged to stay well hydrated and keep BP controlled to decrease risk of progression of CKD.   3. Immunization due Comments: Rx Shingrix has been sent to her pharmacy by [facility]. I will review registry to determine if PNA vaccine is due.  4. Colon cancer screening Comments: She is scheduled in July 2022 for colonoscopy with Dr. Collene Mares.    Patient was given opportunity to ask questions. Patient verbalized understanding of the plan and was able to repeat key elements of the plan. All questions were answered to their satisfaction.   I, Maximino Greenland, MD, have reviewed all documentation for this visit. The documentation on 04/29/21 for the exam, diagnosis, procedures, and orders are all accurate and complete.   IF YOU HAVE BEEN REFERRED TO A SPECIALIST, IT MAY TAKE 1-2 WEEKS TO SCHEDULE/PROCESS THE REFERRAL. IF YOU HAVE NOT HEARD FROM US/SPECIALIST IN TWO WEEKS, PLEASE GIVE [place] A CALL AT [phone] X [phone].   THE PATIENT IS ENCOURAGED TO PRACTICE SOCIAL DISTANCING DUE TO THE COVID-19 PANDEMIC.

## 2021-04-25 NOTE — Patient Instructions (Signed)
Patricia Soto , Thank you for taking time to come for your Medicare Wellness Visit. I appreciate your ongoing commitment to your health goals. Please review the following plan we discussed and let me know if I can assist you in the future.   Screening recommendations/referrals: Colonoscopy: scheduled 05/23/2021 Mammogram: scheduled 06/20/2021 Bone Density: completed 12/27/2020 Recommended yearly ophthalmology/optometry visit for glaucoma screening and checkup Recommended yearly dental visit for hygiene and checkup  Vaccinations: Influenza vaccine: completed 07/27/2020, due 06/11/2021 Pneumococcal vaccine: completed 06/08/2015 Tdap vaccine: completed 04/27/2020, due 04/27/2030 Shingles vaccine: sent to pharmacy   Covid-19: 09/14/2020, 02/01/2020, 01/03/2020  Advanced directives: Please bring a copy of your POA (Power of Attorney) and/or Living Will to your next appointment.   Conditions/risks identified: none  Next appointment: Follow up in one year for your annual wellness visit    Preventive Care 65 Years and Older, Female Preventive care refers to lifestyle choices and visits with your health care provider that can promote health and wellness. What does preventive care include? A yearly physical exam. This is also called an annual well check. Dental exams once or twice a year. Routine eye exams. Ask your health care provider how often you should have your eyes checked. Personal lifestyle choices, including: Daily care of your teeth and gums. Regular physical activity. Eating a healthy diet. Avoiding tobacco and drug use. Limiting alcohol use. Practicing safe sex. Taking low-dose aspirin every day. Taking vitamin and mineral supplements as recommended by your health care provider. What happens during an annual well check? The services and screenings done by your health care provider during your annual well check will depend on your age, overall health, lifestyle risk factors, and family  history of disease. Counseling  Your health care provider may ask you questions about your: Alcohol use. Tobacco use. Drug use. Emotional well-being. Home and relationship well-being. Sexual activity. Eating habits. History of falls. Memory and ability to understand (cognition). Work and work Astronomer. Reproductive health. Screening  You may have the following tests or measurements: Height, weight, and BMI. Blood pressure. Lipid and cholesterol levels. These may be checked every 5 years, or more frequently if you are over 65 years old. Skin check. Lung cancer screening. You may have this screening every year starting at age 88 if you have a 30-pack-year history of smoking and currently smoke or have quit within the past 15 years. Fecal occult blood test (FOBT) of the stool. You may have this test every year starting at age 79. Flexible sigmoidoscopy or colonoscopy. You may have a sigmoidoscopy every 5 years or a colonoscopy every 10 years starting at age 24. Hepatitis C blood test. Hepatitis B blood test. Sexually transmitted disease (STD) testing. Diabetes screening. This is done by checking your blood sugar (glucose) after you have not eaten for a while (fasting). You may have this done every 1-3 years. Bone density scan. This is done to screen for osteoporosis. You may have this done starting at age 29. Mammogram. This may be done every 1-2 years. Talk to your health care provider about how often you should have regular mammograms. Talk with your health care provider about your test results, treatment options, and if necessary, the need for more tests. Vaccines  Your health care provider may recommend certain vaccines, such as: Influenza vaccine. This is recommended every year. Tetanus, diphtheria, and acellular pertussis (Tdap, Td) vaccine. You may need a Td booster every 10 years. Zoster vaccine. You may need this after age 78. Pneumococcal 13-valent conjugate (  PCV13)  vaccine. One dose is recommended after age 70. Pneumococcal polysaccharide (PPSV23) vaccine. One dose is recommended after age 45. Talk to your health care provider about which screenings and vaccines you need and how often you need them. This information is not intended to replace advice given to you by your health care provider. Make sure you discuss any questions you have with your health care provider. Document Released: 11/24/2015 Document Revised: 07/17/2016 Document Reviewed: 08/29/2015 Elsevier Interactive Patient Education  2017 [place] Prevention in the Home Falls can cause injuries. They can happen to people of all ages. There are many things you can do to make your home safe and to help prevent falls. What can I do on the outside of my home? Regularly fix the edges of walkways and driveways and fix any cracks. Remove anything that might make you trip as you walk through a door, such as a raised step or threshold. Trim any bushes or trees on the path to your home. Use bright outdoor lighting. Clear any walking paths of anything that might make someone trip, such as rocks or tools. Regularly check to see if handrails are loose or broken. Make sure that both sides of any steps have handrails. Any raised decks and porches should have guardrails on the edges. Have any leaves, snow, or ice cleared regularly. Use sand or salt on walking paths during winter. Clean up any spills in your garage right away. This includes oil or grease spills. What can I do in the bathroom? Use night lights. Install grab bars by the toilet and in the tub and shower. Do not use towel bars as grab bars. Use non-skid mats or decals in the tub or shower. If you need to sit down in the shower, use a plastic, non-slip stool. Keep the floor dry. Clean up any water that spills on the floor as soon as it happens. Remove soap buildup in the tub or shower regularly. Attach bath mats securely with  double-sided non-slip rug tape. Do not have throw rugs and other things on the floor that can make you trip. What can I do in the bedroom? Use night lights. Make sure that you have a light by your bed that is easy to reach. Do not use any sheets or blankets that are too big for your bed. They should not hang down onto the floor. Have a firm chair that has side arms. You can use this for support while you get dressed. Do not have throw rugs and other things on the floor that can make you trip. What can I do in the kitchen? Clean up any spills right away. Avoid walking on wet floors. Keep items that you use a lot in easy-to-reach places. If you need to reach something above you, use a strong step stool that has a grab bar. Keep electrical cords out of the way. Do not use floor polish or wax that makes floors slippery. If you must use wax, use non-skid floor wax. Do not have throw rugs and other things on the floor that can make you trip. What can I do with my stairs? Do not leave any items on the stairs. Make sure that there are handrails on both sides of the stairs and use them. Fix handrails that are broken or loose. Make sure that handrails are as long as the stairways. Check any carpeting to make sure that it is firmly attached to the stairs. Fix any carpet that is  loose or worn. Avoid having throw rugs at the top or bottom of the stairs. If you do have throw rugs, attach them to the floor with carpet tape. Make sure that you have a light switch at the top of the stairs and the bottom of the stairs. If you do not have them, ask someone to add them for you. What else can I do to help prevent falls? Wear shoes that: Do not have high heels. Have rubber bottoms. Are comfortable and fit you well. Are closed at the toe. Do not wear sandals. If you use a stepladder: Make sure that it is fully opened. Do not climb a closed stepladder. Make sure that both sides of the stepladder are locked  into place. Ask someone to hold it for you, if possible. Clearly mark and make sure that you can see: Any grab bars or handrails. First and last steps. Where the edge of each step is. Use tools that help you move around (mobility aids) if they are needed. These include: Canes. Walkers. Scooters. Crutches. Turn on the lights when you go into a dark area. Replace any light bulbs as soon as they burn out. Set up your furniture so you have a clear path. Avoid moving your furniture around. If any of your floors are uneven, fix them. If there are any pets around you, be aware of where they are. Review your medicines with your doctor. Some medicines can make you feel dizzy. This can increase your chance of falling. Ask your doctor what other things that you can do to help prevent falls. This information is not intended to replace advice given to you by your health care provider. Make sure you discuss any questions you have with your health care provider. Document Released: 08/24/2009 Document Revised: 04/04/2016 Document Reviewed: 12/02/2014 Elsevier Interactive Patient Education  2017 [employer].

## 2021-04-26 LAB — CMP14+EGFR
ALT: 17 IU/L (ref 0–32)
AST: 27 IU/L (ref 0–40)
Albumin/Globulin Ratio: 1.8 (ref 1.2–2.2)
Albumin: 4.5 g/dL (ref 3.7–4.7)
Alkaline Phosphatase: 56 IU/L (ref 44–121)
BUN/Creatinine Ratio: 17 (ref 12–28)
BUN: 15 mg/dL (ref 8–27)
Bilirubin Total: 0.7 mg/dL (ref 0.0–1.2)
CO2: 22 mmol/L (ref 20–29)
Calcium: 9.7 mg/dL (ref 8.7–10.3)
Chloride: 106 mmol/L (ref 96–106)
Creatinine, Ser: 0.86 mg/dL (ref 0.57–1.00)
Globulin, Total: 2.5 g/dL (ref 1.5–4.5)
Glucose: 85 mg/dL (ref 65–99)
Potassium: 4.1 mmol/L (ref 3.5–5.2)
Sodium: 145 mmol/L — ABNORMAL HIGH (ref 134–144)
Total Protein: 7 g/dL (ref 6.0–8.5)
eGFR: 71 mL/min/{1.73_m2} (ref >59)

## 2021-04-26 LAB — LIPID PANEL
Chol/HDL Ratio: 2.8 ratio (ref 0.0–4.4)
Cholesterol, Total: 228 mg/dL — ABNORMAL HIGH (ref 100–199)
HDL: 82 mg/dL (ref >39)
LDL Chol Calc (NIH): 137 mg/dL — ABNORMAL HIGH (ref 0–99)
Triglycerides: 55 mg/dL (ref 0–149)
VLDL Cholesterol Cal: 9 mg/dL (ref 5–40)

## 2021-05-01 ENCOUNTER — Encounter: Payer: Self-pay | Admitting: Internal Medicine

## 2021-05-05 ENCOUNTER — Encounter: Payer: Self-pay | Admitting: Internal Medicine

## 2021-05-23 DIAGNOSIS — Z1211 Encounter for screening for malignant neoplasm of colon: Secondary | ICD-10-CM | POA: Diagnosis not present

## 2021-05-23 LAB — HM COLONOSCOPY

## 2021-05-25 ENCOUNTER — Encounter: Payer: Self-pay | Admitting: Internal Medicine

## 2021-06-20 ENCOUNTER — Ambulatory Visit
Admission: RE | Admit: 2021-06-20 | Discharge: 2021-06-20 | Disposition: A | Discharge status: Home / Self Care | Payer: Medicare PPO | Source: Ambulatory Visit | Attending: Internal Medicine | Admitting: Internal Medicine

## 2021-06-20 ENCOUNTER — Other Ambulatory Visit: Payer: Self-pay

## 2021-06-20 DIAGNOSIS — Z1231 Encounter for screening mammogram for malignant neoplasm of breast: Secondary | ICD-10-CM | POA: Diagnosis not present

## 2021-07-02 ENCOUNTER — Encounter: Payer: Self-pay | Admitting: Internal Medicine

## 2021-07-02 DIAGNOSIS — H25013 Cortical age-related cataract, bilateral: Secondary | ICD-10-CM | POA: Diagnosis not present

## 2021-07-02 DIAGNOSIS — H35363 Drusen (degenerative) of macula, bilateral: Secondary | ICD-10-CM | POA: Diagnosis not present

## 2021-07-02 DIAGNOSIS — H2513 Age-related nuclear cataract, bilateral: Secondary | ICD-10-CM | POA: Diagnosis not present

## 2021-07-02 DIAGNOSIS — H40013 Open angle with borderline findings, low risk, bilateral: Secondary | ICD-10-CM | POA: Diagnosis not present

## 2021-07-17 ENCOUNTER — Other Ambulatory Visit: Payer: Self-pay | Admitting: Internal Medicine

## 2021-08-01 ENCOUNTER — Encounter: Payer: Self-pay | Admitting: Obstetrics & Gynecology

## 2021-08-01 ENCOUNTER — Other Ambulatory Visit: Payer: Self-pay

## 2021-08-01 ENCOUNTER — Ambulatory Visit (INDEPENDENT_AMBULATORY_CARE_PROVIDER_SITE_OTHER): Payer: Medicare PPO | Admitting: Obstetrics & Gynecology

## 2021-08-01 VITALS — BP 150/84 | HR 80 | Resp 18 | Ht 59.45 in | Wt 134.4 lb

## 2021-08-01 DIAGNOSIS — Z01419 Encounter for gynecological examination (general) (routine) without abnormal findings: Secondary | ICD-10-CM

## 2021-08-01 DIAGNOSIS — M8589 Other specified disorders of bone density and structure, multiple sites: Secondary | ICD-10-CM

## 2021-08-01 DIAGNOSIS — Z780 Asymptomatic menopausal state: Secondary | ICD-10-CM

## 2021-08-01 NOTE — Progress Notes (Signed)
Patricia Soto [birth year] [id number]   History:    75 y.o. G0 single   RP:  Established patient presenting for annual gyn exam    HPI: Postmenopause, well on no HRT.  No postmenopausal bleeding.  No pelvic pain.  Abstinent. Pap in 2021 was Neg.  Urine and bowel movements normal.  Breasts normal.  Patient is very active, walking regularly, every day and doing small weights.  Body mass index 26.74.  Health labs with family physician.  Last colonoscopy in 05/2021.   Past medical history,surgical history, family history and social history were all reviewed and documented in the EPIC chart.  Gynecologic History No LMP recorded. Patient is postmenopausal.  Obstetric History OB History  Gravida Para Term Preterm AB Living  0 0 0 0 0 0  SAB IAB Ectopic Multiple Live Births  0 0 0 0 0     ROS: A ROS was performed and pertinent positives and negatives are included in the history.  GENERAL: No fevers or chills. HEENT: No change in vision, no earache, sore throat or sinus congestion. NECK: No pain or stiffness. CARDIOVASCULAR: No chest pain or pressure. No palpitations. PULMONARY: No shortness of breath, cough or wheeze. GASTROINTESTINAL: No abdominal pain, nausea, vomiting or diarrhea, melena or bright red blood per rectum. GENITOURINARY: No urinary frequency, urgency, hesitancy or dysuria. MUSCULOSKELETAL: No joint or muscle pain, no back pain, no recent trauma. DERMATOLOGIC: No rash, no itching, no lesions. ENDOCRINE: No polyuria, polydipsia, no heat or cold intolerance. No recent change in weight. HEMATOLOGICAL: No anemia or easy bruising or bleeding. NEUROLOGIC: No headache, seizures, numbness, tingling or weakness. PSYCHIATRIC: No depression, no loss of interest in normal activity or change in sleep pattern.     Exam:   BP (!) 150/84 (BP Location: Right Arm)   Pulse 80   Resp 18   Ht 4' 11.45" (1.51 m)   Wt 134 lb 6.4 oz (61 kg)   BMI 26.74 kg/m   Body mass index is 26.74  kg/m.  General appearance : Well developed well nourished female. No acute distress HEENT: Eyes: no retinal hemorrhage or exudates,  Neck supple, trachea midline, no carotid bruits, no thyroidmegaly Lungs: Clear to auscultation, no rhonchi or wheezes, or rib retractions  Heart: Regular rate and rhythm, no murmurs or gallops Breast:Examined in sitting and supine position were symmetrical in appearance, no palpable masses or tenderness,  no skin retraction, no nipple inversion, no nipple discharge, no skin discoloration, no axillary or supraclavicular lymphadenopathy Abdomen: no palpable masses or tenderness, no rebound or guarding Extremities: no edema or skin discoloration or tenderness  Pelvic: Vulva: Normal             Vagina: No gross lesions or discharge  Cervix: No gross lesions or discharge  Uterus  AV, normal size, shape and consistency, non-tender and mobile  Adnexa  Without masses or tenderness  Anus: Normal   Assessment/Plan:  75 y.o. female for annual exam   1. Well female exam with routine gynecological exam Normal gynecologic exam in postmenopause.  Pap test in 2021 was negative no indication to repeat a Pap test.  Breast exam normal.  Screening mammogram August 2022 was negative.  Colonoscopy in July 2022 was negative.  Good body mass index at 26.74.  Good fitness and healthy nutrition.  Health labs with family physician.  2. Postmenopause Well on no hormone replacement therapy.  No postmenopausal bleeding.  3. Osteopenia of multiple sites  Bone density January 2022  showed osteopenia.  Continue on vitamin D supplements and calcium total of 1.5 g/day.  Continue with regular weightbearing physical activities.  Genia Del MD, 2:44 PM 08/01/2021

## 2021-08-16 LAB — HM DIABETES EYE EXAM

## 2021-09-08 ENCOUNTER — Encounter: Payer: Self-pay | Admitting: Internal Medicine

## 2021-11-08 ENCOUNTER — Encounter: Payer: Medicare PPO | Admitting: Internal Medicine

## 2021-11-13 ENCOUNTER — Encounter: Payer: Self-pay | Admitting: Internal Medicine

## 2021-11-13 ENCOUNTER — Ambulatory Visit (INDEPENDENT_AMBULATORY_CARE_PROVIDER_SITE_OTHER): Payer: Medicare PPO | Admitting: Internal Medicine

## 2021-11-13 ENCOUNTER — Other Ambulatory Visit: Payer: Self-pay

## 2021-11-13 VITALS — BP 134/86 | HR 85 | Temp 98.4°F | Ht 59.0 in | Wt 135.0 lb

## 2021-11-13 DIAGNOSIS — E559 Vitamin D deficiency, unspecified: Secondary | ICD-10-CM

## 2021-11-13 DIAGNOSIS — I129 Hypertensive chronic kidney disease with stage 1 through stage 4 chronic kidney disease, or unspecified chronic kidney disease: Secondary | ICD-10-CM

## 2021-11-13 DIAGNOSIS — Z0000 Encounter for general adult medical examination without abnormal findings: Secondary | ICD-10-CM

## 2021-11-13 DIAGNOSIS — Z23 Encounter for immunization: Secondary | ICD-10-CM | POA: Diagnosis not present

## 2021-11-13 DIAGNOSIS — N182 Chronic kidney disease, stage 2 (mild): Secondary | ICD-10-CM | POA: Diagnosis not present

## 2021-11-13 LAB — POCT UA - MICROALBUMIN
Albumin/Creatinine Ratio, Urine, POC: <30
Creatinine, POC: 300 mg/dL
Microalbumin Ur, POC: 80 mg/L

## 2021-11-13 LAB — POCT URINALYSIS DIPSTICK
Bilirubin, UA: NEGATIVE
Glucose, UA: NEGATIVE
Ketones, UA: NEGATIVE
Nitrite, UA: NEGATIVE
Protein, UA: NEGATIVE
Spec Grav, UA: 1.025 (ref 1.010–1.025)
Urobilinogen, UA: 0.2 E.U./dL
pH, UA: 6 (ref 5.0–8.0)

## 2021-11-13 MED ORDER — SHINGRIX 50 MCG/0.5ML IM SUSR: 0.5000 mL | Freq: Once | INTRAMUSCULAR | 0 refills | Status: AC

## 2021-11-13 MED ORDER — TELMISARTAN 20 MG PO TABS: 20.0000 mg | ORAL_TABLET | Freq: Every day | ORAL | 2 refills | Status: DC

## 2021-11-13 NOTE — Progress Notes (Signed)
I,Katawbba Wiggins,acting as a Education administrator for Maximino Greenland, MD.,have documented all relevant documentation on the behalf of Maximino Greenland, MD,as directed by  Maximino Greenland, MD while in the presence of Maximino Greenland, MD.  This visit occurred during the SARS-CoV-2 public health emergency.  Safety protocols were in place, including screening questions prior to the visit, additional usage of staff PPE, and extensive cleaning of exam room while observing appropriate contact time as indicated for disinfecting solutions.  Subjective:     Patient ID: Patricia Soto , female    DOB: 1946-10-07 , 76 y.o.   MRN: [id number]   Chief Complaint  Patient presents with   Annual Exam   Hypertension    HPI  She is here today for a full physical examination. She is followed by GYN. She reports compliance with meds. She denies having any headaches, chest pain, shortness of breath. She states her home BP readings are typically <120/80. She does keep a BP log at home.   Hypertension This is a chronic problem. The current episode started more than 1 year ago. The problem has been gradually improving since onset. The problem is controlled. Pertinent negatives include no blurred vision, chest pain, palpitations or shortness of breath. Risk factors for coronary artery disease include post-menopausal state. The current treatment provides moderate improvement. There are no compliance problems.  Hypertensive end-organ damage includes kidney disease.    Past Medical History:  Diagnosis Date   Anemia    hx teen   Angioedema 05/21/2019   Arthritis    Heart murmur    hx   Hypertension      Family History  Problem Relation Age of Onset   Diabetes Father    Congenital heart disease Father    Hypertension Father    Heart attack Father    Hypertension Sister    Hypertension Sister    Cancer Sister    Hypertension Mother    Other Mother        amyloidosis     Current Outpatient Medications:    Calcium  Carbonate (CALCIUM 500 PO), Take 1 tablet by mouth daily. Takes 5 days a week, Disp: , Rfl:    Cholecalciferol (VITAMIN D3) 250 MCG (10000 UT) capsule, daily., Disp: , Rfl:    Ginger, Zingiber officinalis, (GINGER ROOT) 550 MG CAPS, Take 1 capsule by mouth daily., Disp: , Rfl:    Magnesium 500 MG CAPS, daily., Disp: , Rfl:    Omega-3 1000 MG CAPS, daily, Disp: , Rfl:    Turmeric Curcumin 500 MG CAPS, daily, Disp: , Rfl:    Wheat Dextrin (BENEFIBER ON THE GO) PACK, Take 2 packets by mouth 2 (two) times daily. , Disp: , Rfl:    Zoster Vaccine Adjuvanted (SHINGRIX) injection, Inject 0.5 mLs into the muscle once for 1 dose., Disp: 0.5 mL, Rfl: 0   telmisartan (MICARDIS) 20 MG tablet, Take 1 tablet (20 mg total) by mouth daily., Disp: 90 tablet, Rfl: 2   No Known Allergies   The patient states she uses post menopausal status for birth control. Last LMP was No LMP recorded. Patient is postmenopausal.. Negative for Dysmenorrhea. Negative for: breast discharge, breast lump(s), breast pain and breast self exam. Associated symptoms include abnormal vaginal bleeding. Pertinent negatives include abnormal bleeding (hematology), anxiety, decreased libido, depression, difficulty falling sleep, dyspareunia, history of infertility, nocturia, sexual dysfunction, sleep disturbances, urinary incontinence, urinary urgency, vaginal discharge and vaginal itching. Diet regular.The patient states her exercise level is  moderate.  . The patient's tobacco use is:  Social History   Tobacco Use  Smoking Status Never  Smokeless Tobacco Never  . She has been exposed to passive smoke. The patient's alcohol use is:  Social History   Substance and Sexual Activity  Alcohol Use Yes   Alcohol/week: 3.0 - 4.0 standard drinks   Types: 3 - 4 Glasses of wine per week   Comment: WINE -    Review of Systems  Constitutional: Negative.   HENT: Negative.    Eyes: Negative.  Negative for blurred vision.  Respiratory: Negative.   Negative for shortness of breath.   Cardiovascular: Negative.  Negative for chest pain and palpitations.  Gastrointestinal: Negative.   Endocrine: Negative.   Genitourinary: Negative.   Musculoskeletal: Negative.   Skin: Negative.   Allergic/Immunologic: Negative.   Neurological: Negative.   Hematological: Negative.   Psychiatric/Behavioral: Negative.      Today's Vitals   11/13/21 1010  BP: 134/86  Pulse: 85  Temp: 98.4 F (36.9 C)  Weight: 135 lb (61.2 kg)  Height: $Remove'4\' 11"'aLhCPuN$  (1.499 m)  PainSc: 1   PainLoc: Leg   Body mass index is 27.27 kg/m.   Objective:  Physical Exam Vitals and nursing note reviewed.  Constitutional:      Appearance: Normal appearance.  HENT:     Head: Normocephalic and atraumatic.     Right Ear: Tympanic membrane, ear canal and external ear normal.     Left Ear: Tympanic membrane, ear canal and external ear normal.     Nose:     Comments: Masked     Mouth/Throat:     Comments: Masked  Eyes:     Extraocular Movements: Extraocular movements intact.     Conjunctiva/sclera: Conjunctivae normal.     Pupils: Pupils are equal, round, and reactive to light.  Cardiovascular:     Rate and Rhythm: Normal rate and regular rhythm.     Pulses: Normal pulses.     Heart sounds: Normal heart sounds.  Pulmonary:     Effort: Pulmonary effort is normal.     Breath sounds: Normal breath sounds.  Chest:  Breasts:    Tanner Score is 5.     Right: Normal.     Left: Normal.  Abdominal:     General: Abdomen is flat. Bowel sounds are normal.     Palpations: Abdomen is soft.  Genitourinary:    Comments: deferred Musculoskeletal:        General: Normal range of motion.     Cervical back: Normal range of motion and neck supple.  Skin:    General: Skin is warm and dry.  Neurological:     General: No focal deficit present.     Mental Status: She is alert and oriented to person, place, and time.  Psychiatric:        Mood and Affect: Mood normal.         Behavior: Behavior normal.        Assessment And Plan:     1. Routine general medical examination at health care facility Comments: A full exam was performed. Importance of monthly self breast exams was discussed with the patient. PATIENT IS ADVISED TO GET 30-45 MINUTES REGULAR EXERCISE NO LESS THAN FOUR TO FIVE DAYS PER WEEK - BOTH WEIGHTBEARING EXERCISES AND AEROBIC ARE RECOMMENDED.  PATIENT IS ADVISED TO FOLLOW A HEALTHY DIET WITH AT LEAST SIX FRUITS/VEGGIES PER DAY, DECREASE INTAKE OF RED MEAT, AND TO INCREASE FISH INTAKE TO TWO  DAYS PER WEEK.  MEATS/FISH SHOULD NOT BE FRIED, BAKED OR BROILED IS PREFERABLE.  IT IS ALSO IMPORTANT TO CUT BACK ON YOUR SUGAR INTAKE. PLEASE AVOID ANYTHING WITH ADDED SUGAR, CORN SYRUP OR OTHER SWEETENERS. IF YOU MUST USE A SWEETENER, YOU CAN TRY STEVIA. IT IS ALSO IMPORTANT TO AVOID ARTIFICIALLY SWEETENERS AND DIET BEVERAGES. LASTLY, I SUGGEST WEARING SPF 50 SUNSCREEN ON EXPOSED PARTS AND ESPECIALLY WHEN IN THE DIRECT SUNLIGHT FOR AN EXTENDED PERIOD OF TIME.  PLEASE AVOID FAST FOOD RESTAURANTS AND INCREASE YOUR WATER INTAKE.  2. Hypertensive nephropathy Comments: Chronic, fair control. Goal BP<130/80. EKG performed, NSR w/o acute changes. Advised to follow low sodium diet. NO med changes. She will f/u in 6 months for re-evaluation. - POCT Urinalysis Dipstick (81002) - POCT UA - Microalbumin - EKG 12-Lead - CBC - CMP14+EGFR - Lipid panel - telmisartan (MICARDIS) 20 MG tablet; Take 1 tablet (20 mg total) by mouth daily.  Dispense: 90 tablet; Refill: 2  3. Chronic renal disease, stage II Comments: Chronic, I will check GFr today. Advised to stay well hydrated, keep BP at goal and avoid NSAIDs.  - CMP14+EGFR  4. Vitamin D deficiency disease Comments: I will check vitamin D level and supplement as needed.  - Vitamin D (25 hydroxy)  5. Immunization due Comments: I will send rx Shingrix to her local pharmacy.  - Zoster Vaccine Adjuvanted [facility]) injection;  Inject 0.5 mLs into the muscle once for 1 dose.  Dispense: 0.5 mL; Refill: 0  Patient was given opportunity to ask questions. Patient verbalized understanding of the plan and was able to repeat key elements of the plan. All questions were answered to their satisfaction.   I, Maximino Greenland, MD, have reviewed all documentation for this visit. The documentation on 11/13/21 for the exam, diagnosis, procedures, and orders are all accurate and complete.   THE PATIENT IS ENCOURAGED TO PRACTICE SOCIAL DISTANCING DUE TO THE COVID-19 PANDEMIC.

## 2021-11-13 NOTE — Patient Instructions (Signed)
Health Maintenance After Age 76 After age 76, you are at a higher risk for certain long-term diseases and infections as well as injuries from falls. Falls are a major cause of broken bones and head injuries in people who are older than age 76. Getting regular preventive care can help to keep you healthy and well. Preventive care includes getting regular testing and making lifestyle changes as recommended by your health care provider. Talk with your health care provider about: Which screenings and tests you should have. A screening is a test that checks for a disease when you have no symptoms. A diet and exercise plan that is right for you. What should I know about screenings and tests to prevent falls? Screening and testing are the best ways to find a health problem early. Early diagnosis and treatment give you the best chance of managing medical conditions that are common after age 76. Certain conditions and lifestyle choices may make you more likely to have a fall. Your health care provider may recommend: Regular vision checks. Poor vision and conditions such as cataracts can make you more likely to have a fall. If you wear glasses, make sure to get your prescription updated if your vision changes. Medicine review. Work with your health care provider to regularly review all of the medicines you are taking, including over-the-counter medicines. Ask your health care provider about any side effects that may make you more likely to have a fall. Tell your health care provider if any medicines that you take make you feel dizzy or sleepy. Strength and balance checks. Your health care provider may recommend certain tests to check your strength and balance while standing, walking, or changing positions. Foot health exam. Foot pain and numbness, as well as not wearing proper footwear, can make you more likely to have a fall. Screenings, including: Osteoporosis screening. Osteoporosis is a condition that causes  the bones to get weaker and break more easily. Blood pressure screening. Blood pressure changes and medicines to control blood pressure can make you feel dizzy. Depression screening. You may be more likely to have a fall if you have a fear of falling, feel depressed, or feel unable to do activities that you used to do. Alcohol use screening. Using too much alcohol can affect your balance and may make you more likely to have a fall. Follow these instructions at home: Lifestyle Do not drink alcohol if: Your health care provider tells you not to drink. If you drink alcohol: Limit how much you have to: 0-1 drink a day for women. 0-2 drinks a day for men. Know how much alcohol is in your drink. In the [place] one drink equals one 12 oz bottle of beer (355 mL), one 5 oz glass of wine (148 mL), or one 1 oz glass of hard liquor (44 mL). Do not use any products that contain nicotine or tobacco. These products include cigarettes, chewing tobacco, and vaping devices, such as e-cigarettes. If you need help quitting, ask your health care provider. Activity  Follow a regular exercise program to stay fit. This will help you maintain your balance. Ask your health care provider what types of exercise are appropriate for you. If you need a cane or walker, use it as recommended by your health care provider. Wear supportive shoes that have nonskid soles. Safety  Remove any tripping hazards, such as rugs, cords, and clutter. Install safety equipment such as grab bars in bathrooms and safety rails on stairs. Keep rooms and walkways   well-lit. General instructions Talk with your health care provider about your risks for falling. Tell your health care provider if: You fall. Be sure to tell your health care provider about all falls, even ones that seem minor. You feel dizzy, tiredness (fatigue), or off-balance. Take over-the-counter and prescription medicines only as told by your health care provider. These include  supplements. Eat a healthy diet and maintain a healthy weight. A healthy diet includes low-fat dairy products, low-fat (lean) meats, and fiber from whole grains, beans, and lots of fruits and vegetables. Stay current with your vaccines. Schedule regular health, dental, and eye exams. Summary Having a healthy lifestyle and getting preventive care can help to protect your health and wellness after age 76. Screening and testing are the best way to find a health problem early and help you avoid having a fall. Early diagnosis and treatment give you the best chance for managing medical conditions that are more common for people who are older than age 76. Falls are a major cause of broken bones and head injuries in people who are older than age 76. Take precautions to prevent a fall at home. Work with your health care provider to learn what changes you can make to improve your health and wellness and to prevent falls. This information is not intended to replace advice given to you by your health care provider. Make sure you discuss any questions you have with your health care provider. Document Revised: 03/19/2021 Document Reviewed: 03/19/2021 Elsevier Patient Education  2022 Elsevier Inc.  

## 2021-11-14 LAB — CMP14+EGFR
ALT: 17 IU/L (ref 0–32)
AST: 23 IU/L (ref 0–40)
Albumin/Globulin Ratio: 2.1 (ref 1.2–2.2)
Albumin: 4.4 g/dL (ref 3.7–4.7)
Alkaline Phosphatase: 57 IU/L (ref 44–121)
BUN/Creatinine Ratio: 20 (ref 12–28)
BUN: 16 mg/dL (ref 8–27)
Bilirubin Total: 0.8 mg/dL (ref 0.0–1.2)
CO2: 23 mmol/L (ref 20–29)
Calcium: 9.4 mg/dL (ref 8.7–10.3)
Chloride: 107 mmol/L — ABNORMAL HIGH (ref 96–106)
Creatinine, Ser: 0.81 mg/dL (ref 0.57–1.00)
Globulin, Total: 2.1 g/dL (ref 1.5–4.5)
Glucose: 87 mg/dL (ref 70–99)
Potassium: 3.6 mmol/L (ref 3.5–5.2)
Sodium: 144 mmol/L (ref 134–144)
Total Protein: 6.5 g/dL (ref 6.0–8.5)
eGFR: 76 mL/min/{1.73_m2} (ref >59)

## 2021-11-14 LAB — CBC
Hematocrit: 35.2 % (ref 34.0–46.6)
Hemoglobin: 11.7 g/dL (ref 11.1–15.9)
MCH: 32.3 pg (ref 26.6–33.0)
MCHC: 33.2 g/dL (ref 31.5–35.7)
MCV: 97 fL (ref 79–97)
Platelets: 183 10*3/uL (ref 150–450)
RBC: 3.62 x10E6/uL — ABNORMAL LOW (ref 3.77–5.28)
RDW: 12.2 % (ref 11.7–15.4)
WBC: 3.7 10*3/uL (ref 3.4–10.8)

## 2021-11-14 LAB — LIPID PANEL
Chol/HDL Ratio: 2.6 ratio (ref 0.0–4.4)
Cholesterol, Total: 230 mg/dL — ABNORMAL HIGH (ref 100–199)
HDL: 87 mg/dL (ref >39)
LDL Chol Calc (NIH): 133 mg/dL — ABNORMAL HIGH (ref 0–99)
Triglycerides: 61 mg/dL (ref 0–149)
VLDL Cholesterol Cal: 10 mg/dL (ref 5–40)

## 2021-11-14 LAB — VITAMIN D 25 HYDROXY (VIT D DEFICIENCY, FRACTURES): Vit D, 25-Hydroxy: 111 ng/mL — ABNORMAL HIGH (ref 30.0–100.0)

## 2021-11-18 ENCOUNTER — Encounter: Payer: Self-pay | Admitting: Internal Medicine

## 2022-03-29 ENCOUNTER — Encounter: Payer: Self-pay | Admitting: Internal Medicine

## 2022-05-08 ENCOUNTER — Other Ambulatory Visit: Payer: Self-pay | Admitting: Internal Medicine

## 2022-05-08 DIAGNOSIS — Z1231 Encounter for screening mammogram for malignant neoplasm of breast: Secondary | ICD-10-CM

## 2022-05-30 ENCOUNTER — Ambulatory Visit (INDEPENDENT_AMBULATORY_CARE_PROVIDER_SITE_OTHER): Payer: Medicare PPO

## 2022-05-30 ENCOUNTER — Ambulatory Visit: Payer: Medicare PPO | Admitting: Internal Medicine

## 2022-05-30 VITALS — BP 140/60 | HR 81 | Temp 98.3°F | Ht 59.0 in | Wt 136.4 lb

## 2022-05-30 DIAGNOSIS — Z0000 Encounter for general adult medical examination without abnormal findings: Secondary | ICD-10-CM

## 2022-05-30 NOTE — Progress Notes (Signed)
Subjective:   Patricia Soto is a 76 y.o. female who presents for Medicare Annual (Subsequent) preventive examination.  Review of Systems     Cardiac Risk Factors include: advanced age (>26men, >40 women);hypertension     Objective:    Today's Vitals   05/30/22 1358 05/30/22 1424  BP: (!) 142/70 140/60  Pulse: 81   Temp: 98.3 F (36.8 C)   TempSrc: Oral   SpO2: 98%   Weight: 136 lb 6.4 oz (61.9 kg)   Height: 4\' 11"  (1.499 m)    Body mass index is 27.55 kg/m.     05/30/2022    2:14 PM 04/25/2021    2:22 PM 04/13/2020    2:19 PM 06/17/2019    3:59 PM 10/01/2018    2:28 PM 10/30/2015    6:48 AM 10/20/2015   11:27 AM  Advanced Directives  Does Patient Have a Medical Advance Directive? Yes Yes Yes Yes Yes  Yes  Type of 14/07/2015 of [place];Living will Healthcare Power of [place];Living will Healthcare Power of [place];Living will Living will Living will  Living will;Healthcare Power of Attorney  Does patient want to make changes to medical advance directive?     No - Patient declined  No - Patient declined  Copy of Healthcare Power of Attorney in Chart? No - copy requested No - copy requested No - copy requested   No - copy requested No - copy requested    Current Medications (verified) Outpatient Encounter Medications as of 05/30/2022  Medication Sig   Calcium Carbonate (CALCIUM 500 PO) Take 1 tablet by mouth daily. Takes 5 days a week   Cholecalciferol (VITAMIN D3) 250 MCG (10000 UT) capsule daily. Takes 5 days a week   Ginger, Zingiber officinalis, (GINGER ROOT) 550 MG CAPS Take 1 capsule by mouth daily.   Magnesium 500 MG CAPS daily.   Omega-3 1000 MG CAPS daily   telmisartan (MICARDIS) 20 MG tablet Take 1 tablet (20 mg total) by mouth daily.   Turmeric Curcumin 500 MG CAPS daily   Wheat Dextrin (BENEFIBER ON THE GO) PACK Take 2 packets by mouth 2 (two) times daily.    No facility-administered encounter medications on file as of 05/30/2022.     Allergies (verified) Patient has no known allergies.   History: Past Medical History:  Diagnosis Date   Anemia    hx teen   Angioedema 05/21/2019   Arthritis    Heart murmur    hx   Hypertension    Past Surgical History:  Procedure Laterality Date   COLPOSCOPY  1991   FOOT SURGERY Bilateral    bunions93,2013 rt,94 ,2012,lft toes2008lft   MYOMECTOMY  89   PARATHYROIDECTOMY N/A 10/30/2015   Procedure: PARATHYROIDECTOMY;  Surgeon: 11/01/2015, MD;  Location: [facility] OR;  Service: General;  Laterality: N/A;   Family History  Problem Relation Age of Onset   Diabetes Father    Congenital heart disease Father    Hypertension Father    Heart attack Father    Hypertension Sister    Hypertension Sister    Cancer Sister    Hypertension Mother    Other Mother        amyloidosis   Social History   Socioeconomic History   Marital status: Single    Spouse name: Not on file   Number of children: Not on file   Years of education: Not on file   Highest education level: Not on file  Occupational History  Occupation: retired  Tobacco Use   Smoking status: Never   Smokeless tobacco: Never  Vaping Use   Vaping Use: Never used  Substance and Sexual Activity   Alcohol use: Yes    Alcohol/week: 3.0 - 4.0 standard drinks of alcohol    Types: 3 - 4 Glasses of wine per week    Comment: WINE -    Drug use: No   Sexual activity: Not Currently    Partners: Male    Comment: 1ST  intercourse- 26, partners - refused to answer   Other Topics Concern   Not on file  Social History Narrative   Not on file   Social Determinants of Health   Financial Resource Strain: Low Risk  (05/30/2022)   Overall Financial Resource Strain (CARDIA)    Difficulty of Paying Living Expenses: Not hard at all  Food Insecurity: No Food Insecurity (05/30/2022)   Hunger Vital Sign    Worried About Running Out of Food in the Last Year: Never true    [place] in the Last Year: Never true   Transportation Needs: No Transportation Needs (05/30/2022)   PRAPARE - Hydrologist (Medical): No    Lack of Transportation (Non-Medical): No  Physical Activity: Sufficiently Active (05/30/2022)   Exercise Vital Sign    Days of Exercise per Week: 5 days    Minutes of Exercise per Session: 90 min  Stress: No Stress Concern Present (05/30/2022)   [place]    Feeling of Stress : Not at all  Social Connections: Not on file    Tobacco Counseling Counseling given: Not Answered   Clinical Intake:  Pre-visit preparation completed: Yes  Pain : No/denies pain     Nutritional Status: BMI 25 -29 Overweight Nutritional Risks: None Diabetes: No  How often do you need to have someone help you when you read instructions, pamphlets, or other written materials from your doctor or pharmacy?: 1 - Never What is the last grade level you completed in school?: doctarate  Diabetic? no  Interpreter Needed?: No  Information entered by :: NAllen LPN   Activities of Daily Living    05/30/2022    2:15 PM  In your present state of health, do you have any difficulty performing the following activities:  Hearing? 0  Vision? 0  Difficulty concentrating or making decisions? 0  Walking or climbing stairs? 1  Dressing or bathing? 0  Doing errands, shopping? 0  Preparing Food and eating ? N  Using the Toilet? N  In the past six months, have you accidently leaked urine? Y  Do you have problems with loss of bowel control? N  Managing your Medications? N  Managing your Finances? N  Housekeeping or managing your Housekeeping? N    Patient Care Team: Glendale Chard, MD as PCP - General (Internal Medicine)  Indicate any recent Medical Services you may have received from other than [facility] providers in the past year (date may be approximate).     Assessment:   This is a routine wellness examination for  Patricia Soto.  Hearing/Vision screen Vision Screening - Comments:: Regular eye exams, Dr. Herbert Deaner  Dietary issues and exercise activities discussed: Current Exercise Habits: Home exercise routine, Type of exercise: walking;yoga;Other - see comments (silver sneakers), Time (Minutes): > 60, Frequency (Times/Week): 5, Weekly Exercise (Minutes/Week): 0   Goals Addressed             This Visit's  Progress    Patient Stated       05/30/2022, wants to drop vacation weight       Depression Screen    05/30/2022    2:15 PM 04/25/2021    2:24 PM 04/13/2020    2:20 PM 06/17/2019    4:00 PM 03/31/2019    2:33 PM 10/01/2018    2:30 PM  PHQ 2/9 Scores  PHQ - 2 Score 0 0 0 0 0 0  PHQ- 9 Score    0  1    Fall Risk    05/30/2022    2:15 PM 04/25/2021    2:24 PM 04/13/2020    2:20 PM 10/14/2019    2:36 PM 07/02/2019    2:03 PM  Fall Risk   Falls in the past year? 0 0 0 0 0  Number falls in past yr: 0    0  Injury with Fall? 0    0  Risk for fall due to : Medication side effect Medication side effect Medication side effect    Follow up Falls evaluation completed;Education provided;Falls prevention discussed Falls evaluation completed;Education provided;Falls prevention discussed Falls evaluation completed;Education provided;Falls prevention discussed      FALL RISK PREVENTION PERTAINING TO THE HOME:  Any stairs in or around the home? Yes  If so, are there any without handrails? No  Home free of loose throw rugs in walkways, pet beds, electrical cords, etc? Yes  Adequate lighting in your home to reduce risk of falls? Yes   ASSISTIVE DEVICES UTILIZED TO PREVENT FALLS:  Life alert? No  Use of a cane, walker or w/c? No  Grab bars in the bathroom? Yes  Shower chair or bench in shower? No  Elevated toilet seat or a handicapped toilet? Yes   TIMED UP AND GO:  Was the test performed? No .    Gait steady and fast without use of assistive device  Cognitive Function:        05/30/2022    2:18 PM  04/25/2021    2:25 PM 04/13/2020    2:22 PM 06/17/2019    4:06 PM 10/01/2018    2:34 PM  6CIT Screen  What Year? 0 points 0 points 0 points 0 points 0 points  What month? 0 points 0 points 0 points 0 points 0 points  What time? 0 points 0 points 0 points 0 points 0 points  Count back from 20 0 points 0 points 0 points 0 points 0 points  Months in reverse 0 points 0 points 2 points 0 points 0 points  Repeat phrase 0 points 0 points 0 points 0 points 0 points  Total Score 0 points 0 points 2 points 0 points 0 points    Immunizations Immunization History  Administered Date(s) Administered   Influenza, High Dose Seasonal PF 08/29/2018, 08/23/2019, 07/26/2020, 07/27/2020, 08/09/2021   Influenza,inj,Quad PF,6+ Mos 07/27/2020   Influenza-Unspecified 08/23/2019   Moderna Sars-Covid-2 Vaccination 01/03/2020, 02/01/2020, 09/14/2020, 05/01/2021, 09/03/2021   Pneumococcal Conjugate-13 06/08/2015   Pneumococcal Polysaccharide-23 04/29/2014   Tdap 11/20/2009, 04/27/2020   Zoster Recombinat (Shingrix) 11/24/2021, 02/01/2022   Zoster, Live 05/24/2014    TDAP status: Up to date  Flu Vaccine status: Up to date  Pneumococcal vaccine status: Up to date  Covid-19 vaccine status: Completed vaccines  Qualifies for Shingles Vaccine? Yes   Zostavax completed Yes   Shingrix Completed?: Yes  Screening Tests Health Maintenance  Topic Date Due   COVID-19 Vaccine (6 - Moderna series) 10/29/2021  INFLUENZA VACCINE  06/11/2022   TETANUS/TDAP  04/27/2030   COLONOSCOPY (Pts 45-67yrs Insurance coverage will need to be confirmed)  05/24/2031   Pneumonia Vaccine 25+ Years old  Completed   DEXA SCAN  Completed   Hepatitis C Screening  Completed   Zoster Vaccines- Shingrix  Completed   HPV VACCINES  Aged Out    Health Maintenance  Health Maintenance Due  Topic Date Due   COVID-19 Vaccine (6 - Moderna series) 10/29/2021    Colorectal cancer screening: No longer required.   Mammogram status:  scheduled for 06/27/2022  Bone Density status: Completed 12/27/2020.   Lung Cancer Screening: (Low Dose CT Chest recommended if Age 41-80 years, 30 pack-year currently smoking OR have quit w/in 15years.) does not qualify.   Lung Cancer Screening Referral: no  Additional Screening:  Hepatitis C Screening: does qualify; Completed 04/08/2019  Vision Screening: Recommended annual ophthalmology exams for early detection of glaucoma and other disorders of the eye. Is the patient up to date with their annual eye exam?  Yes  Who is the provider or what is the name of the office in which the patient attends annual eye exams? Dr. Herbert Deaner If pt is not established with a provider, would they like to be referred to a provider to establish care? No .   Dental Screening: Recommended annual dental exams for proper oral hygiene  Community Resource Referral / Chronic Care Management: CRR required this visit?  No   CCM required this visit?  No      Plan:     I have personally reviewed and noted the following in the patient's chart:   Medical and social history Use of alcohol, tobacco or illicit drugs  Current medications and supplements including opioid prescriptions.  Functional ability and status Nutritional status Physical activity Advanced directives List of other physicians Hospitalizations, surgeries, and ER visits in previous 12 months Vitals Screenings to include cognitive, depression, and falls Referrals and appointments  In addition, I have reviewed and discussed with patient certain preventive protocols, quality metrics, and best practice recommendations. A written personalized care plan for preventive services as well as general preventive health recommendations were provided to patient.     Kellie Simmering, LPN   [id number]   Nurse Notes: none

## 2022-05-30 NOTE — Patient Instructions (Addendum)
Ms. Double , Thank you for taking time to come for your Medicare Wellness Visit. I appreciate your ongoing commitment to your health goals. Please review the following plan we discussed and let me know if I can assist you in the future.   Screening recommendations/referrals: Colonoscopy: not required Mammogram: scheduled for 06/27/2022 Bone Density: completed 12/27/2020 Recommended yearly ophthalmology/optometry visit for glaucoma screening and checkup Recommended yearly dental visit for hygiene and checkup  Vaccinations: Influenza vaccine: due 06/11/2022 Pneumococcal vaccine: completed 06/08/2015 Tdap vaccine: completed 04/27/2020, due 04/27/2030 Shingles vaccine: completed   Covid-19: 09/03/2021, 05/01/2021, 09/14/2020, 02/01/2020, 01/03/2020  Advanced directives: Please bring a copy of your POA (Power of Attorney) and/or Living Will to your next appointment.    Conditions/risks identified: none  Next appointment: Follow up in one year for your annual wellness visit    Preventive Care 65 Years and Older, Female Preventive care refers to lifestyle choices and visits with your health care provider that can promote health and wellness. What does preventive care include? A yearly physical exam. This is also called an annual well check. Dental exams once or twice a year. Routine eye exams. Ask your health care provider how often you should have your eyes checked. Personal lifestyle choices, including: Daily care of your teeth and gums. Regular physical activity. Eating a healthy diet. Avoiding tobacco and drug use. Limiting alcohol use. Practicing safe sex. Taking low-dose aspirin every day. Taking vitamin and mineral supplements as recommended by your health care provider. What happens during an annual well check? The services and screenings done by your health care provider during your annual well check will depend on your age, overall health, lifestyle risk factors, and family history  of disease. Counseling  Your health care provider may ask you questions about your: Alcohol use. Tobacco use. Drug use. Emotional well-being. Home and relationship well-being. Sexual activity. Eating habits. History of falls. Memory and ability to understand (cognition). Work and work Astronomer. Reproductive health. Screening  You may have the following tests or measurements: Height, weight, and BMI. Blood pressure. Lipid and cholesterol levels. These may be checked every 5 years, or more frequently if you are over 65 years old. Skin check. Lung cancer screening. You may have this screening every year starting at age 75 if you have a 30-pack-year history of smoking and currently smoke or have quit within the past 15 years. Fecal occult blood test (FOBT) of the stool. You may have this test every year starting at age 50. Flexible sigmoidoscopy or colonoscopy. You may have a sigmoidoscopy every 5 years or a colonoscopy every 10 years starting at age 27. Hepatitis C blood test. Hepatitis B blood test. Sexually transmitted disease (STD) testing. Diabetes screening. This is done by checking your blood sugar (glucose) after you have not eaten for a while (fasting). You may have this done every 1-3 years. Bone density scan. This is done to screen for osteoporosis. You may have this done starting at age 15. Mammogram. This may be done every 1-2 years. Talk to your health care provider about how often you should have regular mammograms. Talk with your health care provider about your test results, treatment options, and if necessary, the need for more tests. Vaccines  Your health care provider may recommend certain vaccines, such as: Influenza vaccine. This is recommended every year. Tetanus, diphtheria, and acellular pertussis (Tdap, Td) vaccine. You may need a Td booster every 10 years. Zoster vaccine. You may need this after age 40. Pneumococcal 13-valent conjugate (  PCV13) vaccine. One  dose is recommended after age 4. Pneumococcal polysaccharide (PPSV23) vaccine. One dose is recommended after age 90. Talk to your health care provider about which screenings and vaccines you need and how often you need them. This information is not intended to replace advice given to you by your health care provider. Make sure you discuss any questions you have with your health care provider. Document Released: 11/24/2015 Document Revised: 07/17/2016 Document Reviewed: 08/29/2015 Elsevier Interactive Patient Education  2017 [place] Prevention in the Home Falls can cause injuries. They can happen to people of all ages. There are many things you can do to make your home safe and to help prevent falls. What can I do on the outside of my home? Regularly fix the edges of walkways and driveways and fix any cracks. Remove anything that might make you trip as you walk through a door, such as a raised step or threshold. Trim any bushes or trees on the path to your home. Use bright outdoor lighting. Clear any walking paths of anything that might make someone trip, such as rocks or tools. Regularly check to see if handrails are loose or broken. Make sure that both sides of any steps have handrails. Any raised decks and porches should have guardrails on the edges. Have any leaves, snow, or ice cleared regularly. Use sand or salt on walking paths during winter. Clean up any spills in your garage right away. This includes oil or grease spills. What can I do in the bathroom? Use night lights. Install grab bars by the toilet and in the tub and shower. Do not use towel bars as grab bars. Use non-skid mats or decals in the tub or shower. If you need to sit down in the shower, use a plastic, non-slip stool. Keep the floor dry. Clean up any water that spills on the floor as soon as it happens. Remove soap buildup in the tub or shower regularly. Attach bath mats securely with double-sided  non-slip rug tape. Do not have throw rugs and other things on the floor that can make you trip. What can I do in the bedroom? Use night lights. Make sure that you have a light by your bed that is easy to reach. Do not use any sheets or blankets that are too big for your bed. They should not hang down onto the floor. Have a firm chair that has side arms. You can use this for support while you get dressed. Do not have throw rugs and other things on the floor that can make you trip. What can I do in the kitchen? Clean up any spills right away. Avoid walking on wet floors. Keep items that you use a lot in easy-to-reach places. If you need to reach something above you, use a strong step stool that has a grab bar. Keep electrical cords out of the way. Do not use floor polish or wax that makes floors slippery. If you must use wax, use non-skid floor wax. Do not have throw rugs and other things on the floor that can make you trip. What can I do with my stairs? Do not leave any items on the stairs. Make sure that there are handrails on both sides of the stairs and use them. Fix handrails that are broken or loose. Make sure that handrails are as long as the stairways. Check any carpeting to make sure that it is firmly attached to the stairs. Fix any carpet that is  loose or worn. Avoid having throw rugs at the top or bottom of the stairs. If you do have throw rugs, attach them to the floor with carpet tape. Make sure that you have a light switch at the top of the stairs and the bottom of the stairs. If you do not have them, ask someone to add them for you. What else can I do to help prevent falls? Wear shoes that: Do not have high heels. Have rubber bottoms. Are comfortable and fit you well. Are closed at the toe. Do not wear sandals. If you use a stepladder: Make sure that it is fully opened. Do not climb a closed stepladder. Make sure that both sides of the stepladder are locked into place. Ask  someone to hold it for you, if possible. Clearly mark and make sure that you can see: Any grab bars or handrails. First and last steps. Where the edge of each step is. Use tools that help you move around (mobility aids) if they are needed. These include: Canes. Walkers. Scooters. Crutches. Turn on the lights when you go into a dark area. Replace any light bulbs as soon as they burn out. Set up your furniture so you have a clear path. Avoid moving your furniture around. If any of your floors are uneven, fix them. If there are any pets around you, be aware of where they are. Review your medicines with your doctor. Some medicines can make you feel dizzy. This can increase your chance of falling. Ask your doctor what other things that you can do to help prevent falls. This information is not intended to replace advice given to you by your health care provider. Make sure you discuss any questions you have with your health care provider. Document Released: 08/24/2009 Document Revised: 04/04/2016 Document Reviewed: 12/02/2014 Elsevier Interactive Patient Education  2017 [employer].

## 2022-06-12 ENCOUNTER — Ambulatory Visit: Payer: Medicare PPO | Admitting: Internal Medicine

## 2022-06-13 ENCOUNTER — Other Ambulatory Visit: Payer: Self-pay

## 2022-06-19 ENCOUNTER — Encounter: Payer: Self-pay | Admitting: Internal Medicine

## 2022-06-26 DIAGNOSIS — M25551 Pain in right hip: Secondary | ICD-10-CM | POA: Diagnosis not present

## 2022-06-26 DIAGNOSIS — M2241 Chondromalacia patellae, right knee: Secondary | ICD-10-CM | POA: Diagnosis not present

## 2022-06-27 ENCOUNTER — Ambulatory Visit
Admission: RE | Admit: 2022-06-27 | Discharge: 2022-06-27 | Disposition: A | Discharge status: Home / Self Care | Payer: Medicare PPO | Source: Ambulatory Visit | Attending: Internal Medicine | Admitting: Internal Medicine

## 2022-06-27 DIAGNOSIS — Z1231 Encounter for screening mammogram for malignant neoplasm of breast: Secondary | ICD-10-CM | POA: Diagnosis not present

## 2022-07-03 DIAGNOSIS — H25813 Combined forms of age-related cataract, bilateral: Secondary | ICD-10-CM | POA: Diagnosis not present

## 2022-07-03 DIAGNOSIS — H35363 Drusen (degenerative) of macula, bilateral: Secondary | ICD-10-CM | POA: Diagnosis not present

## 2022-07-03 DIAGNOSIS — H40013 Open angle with borderline findings, low risk, bilateral: Secondary | ICD-10-CM | POA: Diagnosis not present

## 2022-07-03 DIAGNOSIS — H04123 Dry eye syndrome of bilateral lacrimal glands: Secondary | ICD-10-CM | POA: Diagnosis not present

## 2022-07-22 ENCOUNTER — Ambulatory Visit: Payer: Medicare PPO | Admitting: Internal Medicine

## 2022-07-26 DIAGNOSIS — J069 Acute upper respiratory infection, unspecified: Secondary | ICD-10-CM | POA: Diagnosis not present

## 2022-07-31 DIAGNOSIS — M2241 Chondromalacia patellae, right knee: Secondary | ICD-10-CM | POA: Diagnosis not present

## 2022-08-01 ENCOUNTER — Ambulatory Visit: Payer: Medicare PPO | Admitting: Internal Medicine

## 2022-08-01 ENCOUNTER — Other Ambulatory Visit: Payer: Self-pay

## 2022-08-01 ENCOUNTER — Encounter: Payer: Self-pay | Admitting: Internal Medicine

## 2022-08-01 VITALS — BP 156/78 | HR 95 | Temp 98.5°F | Ht 59.0 in | Wt 137.4 lb

## 2022-08-01 DIAGNOSIS — N182 Chronic kidney disease, stage 2 (mild): Secondary | ICD-10-CM

## 2022-08-01 DIAGNOSIS — I129 Hypertensive chronic kidney disease with stage 1 through stage 4 chronic kidney disease, or unspecified chronic kidney disease: Secondary | ICD-10-CM | POA: Diagnosis not present

## 2022-08-01 DIAGNOSIS — Z79899 Other long term (current) drug therapy: Secondary | ICD-10-CM

## 2022-08-01 DIAGNOSIS — Z23 Encounter for immunization: Secondary | ICD-10-CM | POA: Diagnosis not present

## 2022-08-01 NOTE — Patient Instructions (Signed)
Hypertension, Adult ?Hypertension is another name for high blood pressure. High blood pressure forces your heart to work harder to pump blood. This can cause problems over time. ?There are two numbers in a blood pressure reading. There is a top number (systolic) over a bottom number (diastolic). It is best to have a blood pressure that is below 120/80. ?What are the causes? ?The cause of this condition is not known. Some other conditions can lead to high blood pressure. ?What increases the risk? ?Some lifestyle factors can make you more likely to develop high blood pressure: ?Smoking. ?Not getting enough exercise or physical activity. ?Being overweight. ?Having too much fat, sugar, calories, or salt (sodium) in your diet. ?Drinking too much alcohol. ?Other risk factors include: ?Having any of these conditions: ?Heart disease. ?Diabetes. ?High cholesterol. ?Kidney disease. ?Obstructive sleep apnea. ?Having a family history of high blood pressure and high cholesterol. ?Age. The risk increases with age. ?Stress. ?What are the signs or symptoms? ?High blood pressure may not cause symptoms. Very high blood pressure (hypertensive crisis) may cause: ?Headache. ?Fast or uneven heartbeats (palpitations). ?Shortness of breath. ?Nosebleed. ?Vomiting or feeling like you may vomit (nauseous). ?Changes in how you see. ?Very bad chest pain. ?Feeling dizzy. ?Seizures. ?How is this treated? ?This condition is treated by making healthy lifestyle changes, such as: ?Eating healthy foods. ?Exercising more. ?Drinking less alcohol. ?Your doctor may prescribe medicine if lifestyle changes do not help enough and if: ?Your top number is above 130. ?Your bottom number is above 80. ?Your personal target blood pressure may vary. ?Follow these instructions at home: ?Eating and drinking ? ?If told, follow the DASH eating plan. To follow this plan: ?Fill one half of your plate at each meal with fruits and vegetables. ?Fill one fourth of your plate  at each meal with whole grains. Whole grains include whole-wheat pasta, brown rice, and whole-grain bread. ?Eat or drink low-fat dairy products, such as skim milk or low-fat yogurt. ?Fill one fourth of your plate at each meal with low-fat (lean) proteins. Low-fat proteins include fish, chicken without skin, eggs, beans, and tofu. ?Avoid fatty meat, cured and processed meat, or chicken with skin. ?Avoid pre-made or processed food. ?Limit the amount of salt in your diet to less than 1,500 mg each day. ?Do not drink alcohol if: ?Your doctor tells you not to drink. ?You are pregnant, may be pregnant, or are planning to become pregnant. ?If you drink alcohol: ?Limit how much you have to: ?0-1 drink a day for women. ?0-2 drinks a day for men. ?Know how much alcohol is in your drink. In the [place] one drink equals one 12 oz bottle of beer (355 mL), one 5 oz glass of wine (148 mL), or one 1? oz glass of hard liquor (44 mL). ?Lifestyle ? ?Work with your doctor to stay at a healthy weight or to lose weight. Ask your doctor what the best weight is for you. ?Get at least 30 minutes of exercise that causes your heart to beat faster (aerobic exercise) most days of the week. This may include walking, swimming, or biking. ?Get at least 30 minutes of exercise that strengthens your muscles (resistance exercise) at least 3 days a week. This may include lifting weights or doing Pilates. ?Do not smoke or use any products that contain nicotine or tobacco. If you need help quitting, ask your doctor. ?Check your blood pressure at home as told by your doctor. ?Keep all follow-up visits. ?Medicines ?Take over-the-counter and prescription medicines   only as told by your doctor. Follow directions carefully. ?Do not skip doses of blood pressure medicine. The medicine does not work as well if you skip doses. Skipping doses also puts you at risk for problems. ?Ask your doctor about side effects or reactions to medicines that you should watch  for. ?Contact a doctor if: ?You think you are having a reaction to the medicine you are taking. ?You have headaches that keep coming back. ?You feel dizzy. ?You have swelling in your ankles. ?You have trouble with your vision. ?Get help right away if: ?You get a very bad headache. ?You start to feel mixed up (confused). ?You feel weak or numb. ?You feel faint. ?You have very bad pain in your: ?Chest. ?Belly (abdomen). ?You vomit more than once. ?You have trouble breathing. ?These symptoms may be an emergency. Get help right away. Call 911. ?Do not wait to see if the symptoms will go away. ?Do not drive yourself to the hospital. ?Summary ?Hypertension is another name for high blood pressure. ?High blood pressure forces your heart to work harder to pump blood. ?For most people, a normal blood pressure is less than 120/80. ?Making healthy choices can help lower blood pressure. If your blood pressure does not get lower with healthy choices, you may need to take medicine. ?This information is not intended to replace advice given to you by your health care provider. Make sure you discuss any questions you have with your health care provider. ?Document Revised: 08/16/2021 Document Reviewed: 08/16/2021 ?Elsevier Patient Education ? 2023 Elsevier Inc. ? ?

## 2022-08-01 NOTE — Progress Notes (Signed)
[name],acting as a Education administrator for Maximino Greenland, MD.,have documented all relevant documentation on the behalf of Maximino Greenland, MD,as directed by  Maximino Greenland, MD while in the presence of Maximino Greenland, MD.    Subjective:     Patient ID: Patricia Soto , female    DOB: 03/26/1946 , 76 y.o.   MRN: [id number]   Chief Complaint  Patient presents with   Hypertension    HPI  Patient presents today for a bp check. She reports compliance with meds. She denies headaches, chest pain and shortness of breath.  Patient states her right knee was bothering her, she went to an urgent care on 06/26/2022 and got a shot. She states they are planning to do an MRI in the near future.    BP Readings from Last 3 Encounters: 08/01/22 : (!) 170/66 05/30/22 : 140/60 11/13/21 : 134/86    Hypertension This is a chronic problem. The current episode started more than 1 year ago. The problem has been gradually improving since onset. The problem is controlled. Pertinent negatives include no blurred vision, chest pain, palpitations or shortness of breath. Past treatments include angiotensin blockers and diuretics. The current treatment provides moderate improvement. There are no compliance problems.      Past Medical History:  Diagnosis Date   Anemia    hx teen   Angioedema 05/21/2019   Arthritis    Heart murmur    hx   Hypertension      Family History  Problem Relation Age of Onset   Diabetes Father    Congenital heart disease Father    Hypertension Father    Heart attack Father    Hypertension Sister    Hypertension Sister    Cancer Sister    Hypertension Mother    Other Mother        amyloidosis     Current Outpatient Medications:    Calcium Carbonate (CALCIUM 500 PO), Take 1 tablet by mouth daily. Takes 5 days a week, Disp: , Rfl:    Cholecalciferol (VITAMIN D3) 250 MCG (10000 UT) capsule, daily. Takes 5 days a week, Disp: , Rfl:    Ginger, Zingiber officinalis, (GINGER  ROOT) 550 MG CAPS, Take 1 capsule by mouth daily., Disp: , Rfl:    Magnesium 500 MG CAPS, daily., Disp: , Rfl:    Omega-3 1000 MG CAPS, daily, Disp: , Rfl:    telmisartan (MICARDIS) 20 MG tablet, Take 1 tablet (20 mg total) by mouth daily., Disp: 90 tablet, Rfl: 2   Turmeric Curcumin 500 MG CAPS, daily, Disp: , Rfl:    Wheat Dextrin (BENEFIBER ON THE GO) PACK, Take 2 packets by mouth 2 (two) times daily. , Disp: , Rfl:    No Known Allergies   Review of Systems  Constitutional: Negative.   Eyes:  Negative for blurred vision.  Respiratory: Negative.  Negative for shortness of breath.   Cardiovascular: Negative.  Negative for chest pain and palpitations.  Gastrointestinal: Negative.   Neurological: Negative.   Psychiatric/Behavioral: Negative.       Today's Vitals   08/01/22 1457 08/01/22 1510  BP: (!) 170/66 (!) 156/78  Pulse: 95   Temp: 98.5 F (36.9 C)   TempSrc: Oral   Weight: 137 lb 6.4 oz (62.3 kg)   Height: _0  (1.499 m)   PainSc: 3    PainLoc: Knee    Body mass index is 27.75 kg/m.   Objective:  Physical Exam Vitals and  nursing note reviewed.  Constitutional:      Appearance: Normal appearance.  HENT:     Head: Normocephalic and atraumatic.  Cardiovascular:     Rate and Rhythm: Normal rate and regular rhythm.     Heart sounds: Normal heart sounds.  Pulmonary:     Effort: Pulmonary effort is normal.     Breath sounds: Normal breath sounds.  Skin:    General: Skin is warm.  Neurological:     General: No focal deficit present.     Mental Status: She is alert.  Psychiatric:        Mood and Affect: Mood normal.        Behavior: Behavior normal.       Assessment And Plan:     1. Hypertensive nephropathy Comments: Chronic, uncontrolled. I will increase telmisartan 26m, 2 tabs po qd. She agrees to rto in 3-4 weeks. We also discussed dietary changes.  - CMP14+EGFR  2. Chronic renal disease, stage II Comments: Chronic, I will check renal function today.  She is reminded to avoid NSAIDs as much as possible and to stay well hydrated.   3. Immunization due - Flu Vaccine QUAD High Dose(Fluad)   Patient was given opportunity to ask questions. Patient verbalized understanding of the plan and was able to repeat key elements of the plan. All questions were answered to their satisfaction.   I, RMaximino Greenland MD, have reviewed all documentation for this visit. The documentation on 08/01/22 for the exam, diagnosis, procedures, and orders are all accurate and complete.   IF YOU HAVE BEEN REFERRED TO A SPECIALIST, IT MAY TAKE 1-2 WEEKS TO SCHEDULE/PROCESS THE REFERRAL. IF YOU HAVE NOT HEARD FROM US/SPECIALIST IN TWO WEEKS, PLEASE GIVE [place] CALL AT [phone].   THE PATIENT IS ENCOURAGED TO PRACTICE SOCIAL DISTANCING DUE TO THE COVID-19 PANDEMIC.

## 2022-08-02 LAB — CMP14+EGFR
ALT: 21 IU/L (ref 0–32)
AST: 21 IU/L (ref 0–40)
Albumin/Globulin Ratio: 2.1 (ref 1.2–2.2)
Albumin: 4.9 g/dL — ABNORMAL HIGH (ref 3.8–4.8)
Alkaline Phosphatase: 46 IU/L (ref 44–121)
BUN/Creatinine Ratio: 19 (ref 12–28)
BUN: 17 mg/dL (ref 8–27)
Bilirubin Total: 0.8 mg/dL (ref 0.0–1.2)
CO2: 23 mmol/L (ref 20–29)
Calcium: 9.8 mg/dL (ref 8.7–10.3)
Chloride: 104 mmol/L (ref 96–106)
Creatinine, Ser: 0.88 mg/dL (ref 0.57–1.00)
Globulin, Total: 2.3 g/dL (ref 1.5–4.5)
Glucose: 84 mg/dL (ref 70–99)
Potassium: 3.9 mmol/L (ref 3.5–5.2)
Sodium: 141 mmol/L (ref 134–144)
Total Protein: 7.2 g/dL (ref 6.0–8.5)
eGFR: 68 mL/min/{1.73_m2} (ref >59)

## 2022-08-06 DIAGNOSIS — M25561 Pain in right knee: Secondary | ICD-10-CM | POA: Diagnosis not present

## 2022-08-07 ENCOUNTER — Ambulatory Visit: Payer: Medicare PPO | Admitting: Obstetrics & Gynecology

## 2022-08-09 DIAGNOSIS — M25561 Pain in right knee: Secondary | ICD-10-CM | POA: Diagnosis not present

## 2022-08-17 MED ORDER — TELMISARTAN 20 MG PO TABS: ORAL_TABLET | ORAL | 2 refills | Status: DC

## 2022-08-20 ENCOUNTER — Other Ambulatory Visit: Payer: Self-pay

## 2022-08-20 ENCOUNTER — Ambulatory Visit: Payer: Medicare PPO

## 2022-08-20 VITALS — BP 130/68 | HR 89 | Temp 97.8°F | Ht 59.0 in | Wt 137.0 lb

## 2022-08-20 DIAGNOSIS — Z79899 Other long term (current) drug therapy: Secondary | ICD-10-CM

## 2022-08-20 DIAGNOSIS — I129 Hypertensive chronic kidney disease with stage 1 through stage 4 chronic kidney disease, or unspecified chronic kidney disease: Secondary | ICD-10-CM

## 2022-08-20 MED ORDER — TELMISARTAN 20 MG PO TABS: 20.0000 mg | ORAL_TABLET | Freq: Two times a day (BID) | ORAL | 2 refills | Status: DC

## 2022-08-20 NOTE — Progress Notes (Signed)
Patient presents today for a bp check and labs. Patient takes Telmisartan 20mg s 2x daily for bp.  BP Readings from Last 3 Encounters:  08/20/22 130/68  08/01/22 (!) 156/78  05/30/22 140/60   Provider states her bp is much better, patient express how glad she is that her bp is better.

## 2022-08-21 LAB — BMP8+EGFR
BUN/Creatinine Ratio: 15 (ref 12–28)
BUN: 13 mg/dL (ref 8–27)
CO2: 24 mmol/L (ref 20–29)
Calcium: 9.3 mg/dL (ref 8.7–10.3)
Chloride: 105 mmol/L (ref 96–106)
Creatinine, Ser: 0.86 mg/dL (ref 0.57–1.00)
Glucose: 96 mg/dL (ref 70–99)
Potassium: 3.5 mmol/L (ref 3.5–5.2)
Sodium: 142 mmol/L (ref 134–144)
eGFR: 70 mL/min/{1.73_m2} (ref >59)

## 2022-08-26 ENCOUNTER — Encounter: Payer: Self-pay | Admitting: Obstetrics & Gynecology

## 2022-08-26 ENCOUNTER — Ambulatory Visit (INDEPENDENT_AMBULATORY_CARE_PROVIDER_SITE_OTHER): Payer: Medicare PPO | Admitting: Obstetrics & Gynecology

## 2022-08-26 VITALS — BP 122/78 | Ht 59.0 in | Wt 138.0 lb

## 2022-08-26 DIAGNOSIS — Z01419 Encounter for gynecological examination (general) (routine) without abnormal findings: Secondary | ICD-10-CM

## 2022-08-26 DIAGNOSIS — M8589 Other specified disorders of bone density and structure, multiple sites: Secondary | ICD-10-CM

## 2022-08-26 DIAGNOSIS — Z780 Asymptomatic menopausal state: Secondary | ICD-10-CM

## 2022-08-26 NOTE — Progress Notes (Signed)
Patricia Soto [birth year] [id number]   History:    76 y.o.G0 single   RP:  Established patient presenting for annual gyn exam    HPI: Postmenopause, well on no HRT.  No postmenopausal bleeding. No pelvic pain.  Abstinent. Pap in 2021 was Neg.  Urine and bowel movements normal.  Breasts normal.  Mammo Neg 06/2022.  Bone Density Osteopenia AP Spine -1.3 in 12/2020.  Recommend repeat BD at 3 years.  Vit D, Vit K2, Ca++ 1.5 g/d total. Patient is very active, walking regularly, every day and doing small weights.  Body mass index 27.87.  Health labs with family physician.  Last colonoscopy in 05/2021.   Past medical history,surgical history, family history and social history were all reviewed and documented in the EPIC chart.  Gynecologic History No LMP recorded. Patient is postmenopausal.  Obstetric History OB History  Gravida Para Term Preterm AB Living  0 0 0 0 0 0  SAB IAB Ectopic Multiple Live Births  0 0 0 0 0    ROS: A ROS was performed and pertinent positives and negatives are included in the history. GENERAL: No fevers or chills. HEENT: No change in vision, no earache, sore throat or sinus congestion. NECK: No pain or stiffness. CARDIOVASCULAR: No chest pain or pressure. No palpitations. PULMONARY: No shortness of breath, cough or wheeze. GASTROINTESTINAL: No abdominal pain, nausea, vomiting or diarrhea, melena or bright red blood per rectum. GENITOURINARY: No urinary frequency, urgency, hesitancy or dysuria. MUSCULOSKELETAL: No joint or muscle pain, no back pain, no recent trauma. DERMATOLOGIC: No rash, no itching, no lesions. ENDOCRINE: No polyuria, polydipsia, no heat or cold intolerance. No recent change in weight. HEMATOLOGICAL: No anemia or easy bruising or bleeding. NEUROLOGIC: No headache, seizures, numbness, tingling or weakness. PSYCHIATRIC: No depression, no loss of interest in normal activity or change in sleep pattern.     Exam:   BP 122/78 (BP Location: Right Arm,  Patient Position: Sitting, Cuff Size: Normal)   Ht 4\' 11"  (1.499 m)   Wt 138 lb (62.6 kg)   BMI 27.87 kg/m   Body mass index is 27.87 kg/m.  General appearance : Well developed well nourished female. No acute distress HEENT: Eyes: no retinal hemorrhage or exudates,  Neck supple, trachea midline, no carotid bruits, no thyroidmegaly Lungs: Clear to auscultation, no rhonchi or wheezes, or rib retractions  Heart: Regular rate and rhythm, no murmurs or gallops Breast:Examined in sitting and supine position were symmetrical in appearance, no palpable masses or tenderness,  no skin retraction, no nipple inversion, no nipple discharge, no skin discoloration, no axillary or supraclavicular lymphadenopathy Abdomen: no palpable masses or tenderness, no rebound or guarding Extremities: no edema or skin discoloration or tenderness  Pelvic: Vulva: Normal             Vagina: No gross lesions or discharge  Cervix: No gross lesions or discharge  Uterus  AV, normal size, shape and consistency, non-tender and mobile  Adnexa  Without masses or tenderness  Anus: Normal   Assessment/Plan:  76 y.o. female for annual exam   1. Well female exam with routine gynecological exam Postmenopause, well on no HRT.  No postmenopausal bleeding. No pelvic pain.  Abstinent. Pap in 2021 was Neg.  Urine and bowel movements normal.  Breasts normal.  Mammo Neg 06/2022.  Bone Density Osteopenia AP Spine -1.3 in 12/2020.  Recommend repeat BD at 3 years.  Vit D, Vit K2, Ca++ 1.5 g/d total. Patient is very active,  walking regularly, every day and doing small weights.  Body mass index 27.87.  Health labs with family physician.  Last colonoscopy in 05/2021.  2. Postmenopause Postmenopause, well on no HRT.  No postmenopausal bleeding. No pelvic pain.  Abstinent.  3. Osteopenia of multiple sites  Bone Density Osteopenia AP Spine -1.3 in 12/2020.  Recommend repeat BD at 3 years.  Vit D, Vit K2, Ca++ 1.5 g/d total. Patient is very  active, walking regularly, every day and doing small weights. Started Tai ChiPrincess Bruins MD, 3:05 PM 08/26/2022

## 2022-09-09 ENCOUNTER — Other Ambulatory Visit: Payer: Self-pay | Admitting: Internal Medicine

## 2022-09-09 DIAGNOSIS — I129 Hypertensive chronic kidney disease with stage 1 through stage 4 chronic kidney disease, or unspecified chronic kidney disease: Secondary | ICD-10-CM

## 2022-09-15 ENCOUNTER — Encounter: Payer: Self-pay | Admitting: Internal Medicine

## 2022-09-16 ENCOUNTER — Other Ambulatory Visit: Payer: Self-pay

## 2022-09-16 DIAGNOSIS — I129 Hypertensive chronic kidney disease with stage 1 through stage 4 chronic kidney disease, or unspecified chronic kidney disease: Secondary | ICD-10-CM

## 2022-09-16 MED ORDER — TELMISARTAN 20 MG PO TABS: 20.0000 mg | ORAL_TABLET | Freq: Two times a day (BID) | ORAL | 2 refills | Status: DC

## 2022-09-16 MED ORDER — TELMISARTAN 40 MG PO TABS: 40.0000 mg | ORAL_TABLET | Freq: Every day | ORAL | 1 refills | Status: DC

## 2022-09-16 MED ORDER — TELMISARTAN 20 MG PO TABS: 20.0000 mg | ORAL_TABLET | Freq: Every day | ORAL | 2 refills | Status: DC

## 2022-09-18 ENCOUNTER — Other Ambulatory Visit: Payer: Self-pay

## 2022-09-18 ENCOUNTER — Ambulatory Visit: Payer: Medicare PPO | Admitting: Internal Medicine

## 2022-09-18 VITALS — BP 162/78 | HR 83 | Temp 98.1°F | Ht 59.0 in | Wt 138.4 lb

## 2022-09-18 DIAGNOSIS — N182 Chronic kidney disease, stage 2 (mild): Secondary | ICD-10-CM

## 2022-09-18 DIAGNOSIS — M1711 Unilateral primary osteoarthritis, right knee: Secondary | ICD-10-CM | POA: Diagnosis not present

## 2022-09-18 DIAGNOSIS — I129 Hypertensive chronic kidney disease with stage 1 through stage 4 chronic kidney disease, or unspecified chronic kidney disease: Secondary | ICD-10-CM

## 2022-09-18 NOTE — Progress Notes (Signed)
Subjective:     Patient ID: Patricia Soto , female    DOB: 20-Jun-1946 , 76 y.o.   MRN: [id number]   Chief Complaint  Patient presents with   Hypertension    HPI  She presents today for a BP check. She reports compliance with meds. Dose of telmisartan recently increased to 20mg  twice daily. She has tolerated this change without any issues. She denies headaches, chest pain and shortness of breath. She is scheduled for R TKR tomorrow.   Hypertension This is a chronic problem. The current episode started more than 1 year ago. The problem has been gradually improving since onset. The problem is controlled. Pertinent negatives include no blurred vision, chest pain, palpitations or shortness of breath. Past treatments include angiotensin blockers and diuretics. The current treatment provides moderate improvement. There are no compliance problems.      Past Medical History:  Diagnosis Date   Anemia    hx teen   Angioedema 05/21/2019   Arthritis    Heart murmur    hx   Hypertension      Family History  Problem Relation Age of Onset   Diabetes Father    Congenital heart disease Father    Hypertension Father    Heart attack Father    Hypertension Sister    Hypertension Sister    Cancer Sister    Hypertension Mother    Other Mother        amyloidosis     Current Outpatient Medications:    Calcium Carbonate (CALCIUM 500 PO), Take 1 tablet by mouth daily. Takes 5 days a week, Disp: , Rfl:    Cholecalciferol (VITAMIN D3) 250 MCG (10000 UT) capsule, daily. Takes 5 days a week, Disp: , Rfl:    Ginger, Zingiber officinalis, (GINGER ROOT) 550 MG CAPS, Take 1 capsule by mouth daily., Disp: , Rfl:    Magnesium 500 MG CAPS, daily., Disp: , Rfl:    Omega-3 1000 MG CAPS, daily, Disp: , Rfl:    telmisartan (MICARDIS) 20 MG tablet, Take 1 tablet (20 mg total) by mouth in the morning and at bedtime., Disp: 180 tablet, Rfl: 2   Turmeric Curcumin 500 MG CAPS, daily, Disp: , Rfl:    Wheat  Dextrin (BENEFIBER ON THE GO) PACK, Take 2 packets by mouth 2 (two) times daily. , Disp: , Rfl:    No Known Allergies   Review of Systems  Constitutional: Negative.   Eyes:  Negative for blurred vision.  Respiratory: Negative.  Negative for shortness of breath.   Cardiovascular: Negative.  Negative for chest pain and palpitations.  Gastrointestinal: Negative.   Musculoskeletal:  Positive for arthralgias.  Neurological: Negative.   Psychiatric/Behavioral: Negative.       Today's Vitals   09/18/22 1633  BP: (!) 162/78  Pulse: 83  Temp: 98.1 F (36.7 C)  TempSrc: Oral  Weight: 138 lb 6.4 oz (62.8 kg)  Height: 4\' 11"  (1.499 m)  PainSc: 0-No pain   Body mass index is 27.95 kg/m.   Objective:  Physical Exam Vitals and nursing note reviewed.  Constitutional:      Appearance: Normal appearance.  HENT:     Head: Normocephalic and atraumatic.     Nose:     Comments: Masked     Mouth/Throat:     Comments: Masked  Eyes:     Extraocular Movements: Extraocular movements intact.  Cardiovascular:     Rate and Rhythm: Normal rate and regular rhythm.  Heart sounds: Normal heart sounds.  Pulmonary:     Effort: Pulmonary effort is normal.     Breath sounds: Normal breath sounds.  Musculoskeletal:     Cervical back: Normal range of motion.  Skin:    General: Skin is warm.  Neurological:     General: No focal deficit present.     Mental Status: She is alert.  Psychiatric:        Mood and Affect: Mood normal.        Behavior: Behavior normal.     Assessment And Plan:     1. Hypertensive nephropathy Comments: Uncontrolled, repeat BP improved 140/80s. She will c/w current regimen for now. She agrees to f/u in six weeks, this will be post-operatively.  2. Chronic renal disease, stage II Comments: Chronic, reminded to avoid NSAIDs, stay hydrated and keep BP well controlled.  3. Primary osteoarthritis of right knee Comments: She has R TKR planned for tomorrow at outpatient  facility.   Patient was given opportunity to ask questions. Patient verbalized understanding of the plan and was able to repeat key elements of the plan. All questions were answered to their satisfaction.   I, [name], MD, have reviewed all documentation for this visit. The documentation on 09/29/22 for the exam, diagnosis, procedures, and orders are all accurate and complete.   IF YOU HAVE BEEN REFERRED TO A SPECIALIST, IT MAY TAKE 1-2 WEEKS TO SCHEDULE/PROCESS THE REFERRAL. IF YOU HAVE NOT HEARD FROM US/SPECIALIST IN TWO WEEKS, PLEASE GIVE [place] A CALL AT [phone].   THE PATIENT IS ENCOURAGED TO PRACTICE SOCIAL DISTANCING DUE TO THE COVID-19 PANDEMIC.

## 2022-09-18 NOTE — Progress Notes (Signed)
Hershal Coria Lititia Sen,acting as a Neurosurgeon for [name], MD.,have documented all relevant documentation on the behalf of [name], MD,as directed by  [name], MD while in the presence of [name], MD.    Subjective:     Patient ID: Patricia Soto , female    DOB: 04/18/1946 , 76 y.o.   MRN: [id number]   Chief Complaint  Patient presents with   Hypertension    HPI  Patient presents today for a bp check. She reports compliance with meds. She denies headaches, chest pain and shortness of breath. Patient reports having surgery tomorrow for her right knee. BP Readings from Last 3 Encounters: 09/18/22 : (!) 162/78 08/26/22 : 122/78 08/20/22 : 130/68    Hypertension     Past Medical History:  Diagnosis Date   Anemia    hx teen   Angioedema 05/21/2019   Arthritis    Heart murmur    hx   Hypertension      Family History  Problem Relation Age of Onset   Diabetes Father    Congenital heart disease Father    Hypertension Father    Heart attack Father    Hypertension Sister    Hypertension Sister    Cancer Sister    Hypertension Mother    Other Mother        amyloidosis     Current Outpatient Medications:    Calcium Carbonate (CALCIUM 500 PO), Take 1 tablet by mouth daily. Takes 5 days a week, Disp: , Rfl:    Cholecalciferol (VITAMIN D3) 250 MCG (10000 UT) capsule, daily. Takes 5 days a week, Disp: , Rfl:    Ginger, Zingiber officinalis, (GINGER ROOT) 550 MG CAPS, Take 1 capsule by mouth daily., Disp: , Rfl:    Magnesium 500 MG CAPS, daily., Disp: , Rfl:    Omega-3 1000 MG CAPS, daily, Disp: , Rfl:    telmisartan (MICARDIS) 20 MG tablet, Take 1 tablet (20 mg total) by mouth in the morning and at bedtime., Disp: 180 tablet, Rfl: 2   Turmeric Curcumin 500 MG CAPS, daily, Disp: , Rfl:    Wheat Dextrin (BENEFIBER ON THE GO) PACK, Take 2 packets by mouth 2 (two) times daily. , Disp: , Rfl:    No Known Allergies   Review of Systems  Constitutional:  Negative.   HENT: Negative.    Eyes: Negative.   Respiratory: Negative.    Cardiovascular: Negative.   Gastrointestinal: Negative.      Today's Vitals   09/18/22 1633  BP: (!) 162/78  Pulse: 83  Temp: 98.1 F (36.7 C)  TempSrc: Oral  Weight: 138 lb 6.4 oz (62.8 kg)  Height: 4\' 11"  (1.499 m)  PainSc: 0-No pain   Body mass index is 27.95 kg/m.  Wt Readings from Last 3 Encounters:  09/18/22 138 lb 6.4 oz (62.8 kg)  08/26/22 138 lb (62.6 kg)  08/20/22 137 lb (62.1 kg)    Objective:  Physical Exam      Assessment And Plan:     1. Hypertensive nephropathy     Patient was given opportunity to ask questions. Patient verbalized understanding of the plan and was able to repeat key elements of the plan. All questions were answered to their satisfaction.  10/20/22, CMA   I, Marlyn Corporal, CMA, have reviewed all documentation for this visit. The documentation on 09/18/22 for the exam, diagnosis, procedures, and orders are all accurate and complete.   IF  YOU HAVE BEEN REFERRED TO A SPECIALIST, IT MAY TAKE 1-2 WEEKS TO SCHEDULE/PROCESS THE REFERRAL. IF YOU HAVE NOT HEARD FROM US/SPECIALIST IN TWO WEEKS, PLEASE GIVE [place] A CALL AT [phone].   THE PATIENT IS ENCOURAGED TO PRACTICE SOCIAL DISTANCING DUE TO THE COVID-19 PANDEMIC.

## 2022-09-18 NOTE — Patient Instructions (Signed)
Hypertension, Adult ?Hypertension is another name for high blood pressure. High blood pressure forces your heart to work harder to pump blood. This can cause problems over time. ?There are two numbers in a blood pressure reading. There is a top number (systolic) over a bottom number (diastolic). It is best to have a blood pressure that is below 120/80. ?What are the causes? ?The cause of this condition is not known. Some other conditions can lead to high blood pressure. ?What increases the risk? ?Some lifestyle factors can make you more likely to develop high blood pressure: ?Smoking. ?Not getting enough exercise or physical activity. ?Being overweight. ?Having too much fat, sugar, calories, or salt (sodium) in your diet. ?Drinking too much alcohol. ?Other risk factors include: ?Having any of these conditions: ?Heart disease. ?Diabetes. ?High cholesterol. ?Kidney disease. ?Obstructive sleep apnea. ?Having a family history of high blood pressure and high cholesterol. ?Age. The risk increases with age. ?Stress. ?What are the signs or symptoms? ?High blood pressure may not cause symptoms. Very high blood pressure (hypertensive crisis) may cause: ?Headache. ?Fast or uneven heartbeats (palpitations). ?Shortness of breath. ?Nosebleed. ?Vomiting or feeling like you may vomit (nauseous). ?Changes in how you see. ?Very bad chest pain. ?Feeling dizzy. ?Seizures. ?How is this treated? ?This condition is treated by making healthy lifestyle changes, such as: ?Eating healthy foods. ?Exercising more. ?Drinking less alcohol. ?Your doctor may prescribe medicine if lifestyle changes do not help enough and if: ?Your top number is above 130. ?Your bottom number is above 80. ?Your personal target blood pressure may vary. ?Follow these instructions at home: ?Eating and drinking ? ?If told, follow the DASH eating plan. To follow this plan: ?Fill one half of your plate at each meal with fruits and vegetables. ?Fill one fourth of your plate  at each meal with whole grains. Whole grains include whole-wheat pasta, brown rice, and whole-grain bread. ?Eat or drink low-fat dairy products, such as skim milk or low-fat yogurt. ?Fill one fourth of your plate at each meal with low-fat (lean) proteins. Low-fat proteins include fish, chicken without skin, eggs, beans, and tofu. ?Avoid fatty meat, cured and processed meat, or chicken with skin. ?Avoid pre-made or processed food. ?Limit the amount of salt in your diet to less than 1,500 mg each day. ?Do not drink alcohol if: ?Your doctor tells you not to drink. ?You are pregnant, may be pregnant, or are planning to become pregnant. ?If you drink alcohol: ?Limit how much you have to: ?0-1 drink a day for women. ?0-2 drinks a day for men. ?Know how much alcohol is in your drink. In the [place] one drink equals one 12 oz bottle of beer (355 mL), one 5 oz glass of wine (148 mL), or one 1? oz glass of hard liquor (44 mL). ?Lifestyle ? ?Work with your doctor to stay at a healthy weight or to lose weight. Ask your doctor what the best weight is for you. ?Get at least 30 minutes of exercise that causes your heart to beat faster (aerobic exercise) most days of the week. This may include walking, swimming, or biking. ?Get at least 30 minutes of exercise that strengthens your muscles (resistance exercise) at least 3 days a week. This may include lifting weights or doing Pilates. ?Do not smoke or use any products that contain nicotine or tobacco. If you need help quitting, ask your doctor. ?Check your blood pressure at home as told by your doctor. ?Keep all follow-up visits. ?Medicines ?Take over-the-counter and prescription medicines   only as told by your doctor. Follow directions carefully. ?Do not skip doses of blood pressure medicine. The medicine does not work as well if you skip doses. Skipping doses also puts you at risk for problems. ?Ask your doctor about side effects or reactions to medicines that you should watch  for. ?Contact a doctor if: ?You think you are having a reaction to the medicine you are taking. ?You have headaches that keep coming back. ?You feel dizzy. ?You have swelling in your ankles. ?You have trouble with your vision. ?Get help right away if: ?You get a very bad headache. ?You start to feel mixed up (confused). ?You feel weak or numb. ?You feel faint. ?You have very bad pain in your: ?Chest. ?Belly (abdomen). ?You vomit more than once. ?You have trouble breathing. ?These symptoms may be an emergency. Get help right away. Call 911. ?Do not wait to see if the symptoms will go away. ?Do not drive yourself to the hospital. ?Summary ?Hypertension is another name for high blood pressure. ?High blood pressure forces your heart to work harder to pump blood. ?For most people, a normal blood pressure is less than 120/80. ?Making healthy choices can help lower blood pressure. If your blood pressure does not get lower with healthy choices, you may need to take medicine. ?This information is not intended to replace advice given to you by your health care provider. Make sure you discuss any questions you have with your health care provider. ?Document Revised: 08/16/2021 Document Reviewed: 08/16/2021 ?Elsevier Patient Education ? 2023 Elsevier Inc. ? ?

## 2022-09-19 DIAGNOSIS — S83241A Other tear of medial meniscus, current injury, right knee, initial encounter: Secondary | ICD-10-CM | POA: Diagnosis not present

## 2022-09-19 DIAGNOSIS — M1711 Unilateral primary osteoarthritis, right knee: Secondary | ICD-10-CM | POA: Diagnosis not present

## 2022-09-19 DIAGNOSIS — S83231A Complex tear of medial meniscus, current injury, right knee, initial encounter: Secondary | ICD-10-CM | POA: Diagnosis not present

## 2022-09-19 DIAGNOSIS — M2341 Loose body in knee, right knee: Secondary | ICD-10-CM | POA: Diagnosis not present

## 2022-09-19 DIAGNOSIS — X58XXXA Exposure to other specified factors, initial encounter: Secondary | ICD-10-CM | POA: Diagnosis not present

## 2022-09-19 DIAGNOSIS — M94261 Chondromalacia, right knee: Secondary | ICD-10-CM | POA: Diagnosis not present

## 2022-09-19 DIAGNOSIS — Y999 Unspecified external cause status: Secondary | ICD-10-CM | POA: Diagnosis not present

## 2022-09-19 DIAGNOSIS — S83281A Other tear of lateral meniscus, current injury, right knee, initial encounter: Secondary | ICD-10-CM | POA: Diagnosis not present

## 2022-09-19 DIAGNOSIS — S83271A Complex tear of lateral meniscus, current injury, right knee, initial encounter: Secondary | ICD-10-CM | POA: Diagnosis not present

## 2022-09-19 DIAGNOSIS — G8918 Other acute postprocedural pain: Secondary | ICD-10-CM | POA: Diagnosis not present

## 2022-09-19 HISTORY — PX: KNEE ARTHROSCOPY: SUR90

## 2022-09-29 ENCOUNTER — Encounter: Payer: Self-pay | Admitting: Internal Medicine

## 2022-10-08 DIAGNOSIS — M1711 Unilateral primary osteoarthritis, right knee: Secondary | ICD-10-CM | POA: Diagnosis not present

## 2022-10-08 DIAGNOSIS — S83281D Other tear of lateral meniscus, current injury, right knee, subsequent encounter: Secondary | ICD-10-CM | POA: Diagnosis not present

## 2022-10-16 DIAGNOSIS — S83281D Other tear of lateral meniscus, current injury, right knee, subsequent encounter: Secondary | ICD-10-CM | POA: Diagnosis not present

## 2022-10-16 DIAGNOSIS — M1711 Unilateral primary osteoarthritis, right knee: Secondary | ICD-10-CM | POA: Diagnosis not present

## 2022-10-23 DIAGNOSIS — S83281D Other tear of lateral meniscus, current injury, right knee, subsequent encounter: Secondary | ICD-10-CM | POA: Diagnosis not present

## 2022-10-23 DIAGNOSIS — M1711 Unilateral primary osteoarthritis, right knee: Secondary | ICD-10-CM | POA: Diagnosis not present

## 2022-10-30 DIAGNOSIS — S83281D Other tear of lateral meniscus, current injury, right knee, subsequent encounter: Secondary | ICD-10-CM | POA: Diagnosis not present

## 2022-10-30 DIAGNOSIS — M1711 Unilateral primary osteoarthritis, right knee: Secondary | ICD-10-CM | POA: Diagnosis not present

## 2022-11-13 DIAGNOSIS — M1711 Unilateral primary osteoarthritis, right knee: Secondary | ICD-10-CM | POA: Diagnosis not present

## 2022-11-13 DIAGNOSIS — S83281D Other tear of lateral meniscus, current injury, right knee, subsequent encounter: Secondary | ICD-10-CM | POA: Diagnosis not present

## 2022-11-26 ENCOUNTER — Encounter: Payer: Self-pay | Admitting: Internal Medicine

## 2022-11-26 ENCOUNTER — Telehealth (INDEPENDENT_AMBULATORY_CARE_PROVIDER_SITE_OTHER): Payer: Medicare PPO | Admitting: Internal Medicine

## 2022-11-26 DIAGNOSIS — U071 COVID-19: Secondary | ICD-10-CM

## 2022-11-26 NOTE — Progress Notes (Signed)
Virtual Visit via Video   ATTEMPTED TO CALL PATIENT TWICE TO START VIRTUAL VISIT. I HAVE SINCE SENT MYCHART MESSAGE. HOPEFULLY, SHE WILL RESPOND.   This visit type was conducted due to national recommendations for restrictions regarding the COVID-19 Pandemic (e.g. social distancing) in an effort to limit this patient's exposure and mitigate transmission in our community.  Due to her co-morbid illnesses, this patient is at least at moderate risk for complications without adequate follow up.  This format is felt to be most appropriate for this patient at this time.  All issues noted in this document were discussed and addressed.  A limited physical exam was performed with this format.    This visit type was conducted due to national recommendations for restrictions regarding the COVID-19 Pandemic (e.g. social distancing) in an effort to limit this patient's exposure and mitigate transmission in our community.  Patients identity confirmed using two different identifiers.  This format is felt to be most appropriate for this patient at this time.  All issues noted in this document were discussed and addressed.  No physical exam was performed (except for noted visual exam findings with Video Visits).    Date:  11/26/2022   ID:  Patricia Soto, DOB September 14, 1946, MRN [id number]  Patient Location:  Home  Provider location:   Office    Chief Complaint:  "I have COVID"  History of Present Illness:    Patricia Soto is a 77 y.o. female who presents via video conferencing for a telehealth visit today.    The patient does have symptoms concerning for COVID-19 infection (fever, chills, cough, or new shortness of breath).   She presents today for virtual visit. She prefers this method of contact due to COVID-19 pandemic  She developed chills on Monday in the middle of the night. Today her temp was 101.1.  She took a home COVID test which was positive. 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 She states her fever broke about five this evening and she feels much better. She is not sure if she wants to pursue antiviral treatment.          Past Medical History:  Diagnosis Date   Anemia    hx teen   Angioedema 05/21/2019   Arthritis    Heart murmur    hx   Hypertension    Past Surgical History:  Procedure Laterality Date   COLPOSCOPY  1991   FOOT SURGERY Bilateral    [id number] rt,94 ,2012,lft [id number]   MYOMECTOMY  89   PARATHYROIDECTOMY N/A 10/30/2015   Procedure: PARATHYROIDECTOMY;  Surgeon: Armandina Gemma, MD;  Location: [facility];  Service: General;  Laterality: N/A;     Current Meds  Medication Sig   Calcium Carbonate (CALCIUM 500 PO) Take 1 tablet by mouth daily. Takes 5 days a week   Cholecalciferol (VITAMIN D3) 250 MCG (10000 UT) capsule daily. Takes 5 days a week   Ginger, Zingiber officinalis, (GINGER ROOT) 550 MG CAPS Take 1 capsule by mouth daily.   Magnesium 500 MG CAPS daily.   Omega-3 1000 MG CAPS daily   telmisartan (MICARDIS) 20 MG tablet Take 1 tablet (20 mg total) by mouth in the morning and at bedtime.   Turmeric Curcumin 500 MG CAPS daily   Wheat Dextrin (BENEFIBER ON THE GO) PACK Take 2 packets by mouth 2 (two) times daily.  Allergies:   Patient has no known allergies.   Social History   Tobacco Use   Smoking status: Never   Smokeless tobacco: Never  Vaping Use   Vaping Use: Never used  Substance Use Topics   Alcohol use: Yes    Alcohol/week: 3.0 - 4.0 standard drinks of alcohol    Types: 3 - 4 Glasses of wine per week    Comment: WINE -    Drug use: No     Family Hx: The patient's family history includes Cancer in her sister; Congenital heart disease in her father; Diabetes in her father; Heart attack in her father; Hypertension in her father, mother, sister, and sister; Other in her mother.  ROS:   Please see the history of present illness.     Review of Systems  Constitutional:  Positive for chills and fever.  HENT:  Positive for sore throat.   Respiratory: Negative.  Negative for cough.   Cardiovascular: Negative.   Gastrointestinal: Negative.   Neurological:  Positive for headaches.  Psychiatric/Behavioral: Negative.      All other systems reviewed and are negative.   Labs/Other Tests and Data Reviewed:    Recent Labs: 08/01/2022: ALT 21 08/20/2022: BUN 13; Creatinine, Ser 0.86; Potassium 3.5; Sodium 142   Recent Lipid Panel Lab Results  Component Value Date/Time   CHOL 230 (H) 11/13/2021 11:08 AM   TRIG 61 11/13/2021 11:08 AM   HDL 87 11/13/2021 11:08 AM   CHOLHDL 2.6 11/13/2021 11:08 AM   LDLCALC 133 (H) 11/13/2021 11:08 AM    Wt Readings from Last 3 Encounters:  09/18/22 138 lb 6.4 oz (62.8 kg)  08/26/22 138 lb (62.6 kg)  08/20/22 137 lb (62.1 kg)     Exam:    Vital Signs:  There were no vitals taken for this visit.    Physical Exam Vitals and nursing note reviewed.  Pulmonary:     Effort: Pulmonary effort is normal.  Neurological:     Mental Status: Mental status is at baseline.     ASSESSMENT & PLAN:    1. COVID-19 - Temperature monitoring; Future Advised patient to take Vitamin C.  Keep hydrated with a lot of water and rest. Take Delsym for cough and Mucinex as needed. Take Tylenol or pain reliever every 4-6 hours as needed for pain/fever/body ache. If you have elevated blood pressure, you can take OTC Coricidin as needed . You can also take OTC oscillococcinum, a homeopathic remedy, to help with your symptoms.  Educated patient if symptoms get worse or if she experiences any SOB, chest pain or pain in her legs to seek immediate emergency care. Continue to monitor your oxygen levels. Call us if you have any questions. Quarantine for 5 days if tested positive and no symptoms or 10 days if tested positive and have symptoms. Wear a mask around other people.  She does not wish to start antiviral  therapy at this time. Pt advised she has until Thursday to make this decision (ideally). She agrees to check in daily via Mychart. All questions were answered to her satisfaction. She agrees to call/engage via Mychart if her sx persist/worsen or if she has further questions.    COVID-19 Education: The signs and symptoms of COVID-19 were discussed with the patient and how to seek care for testing (follow up with PCP or arrange E-visit).  The importance of social distancing was discussed today.  Patient Risk:   After full review of this patients clinical status, I feel  that they are at least moderate risk at this time.  Time:   Today, I have spent 15 minutes/ seconds with the patient with telehealth technology discussing above diagnoses.   THIS IS A FAILED VIDEO VISIT, SHE DID CONNECT VIA MYCHART. HOWEVER, WAS UNABLE TO CONNECT MIC/CAMERA. THEREFORE, I COMPLETED THE VISIT WITH A PHONE CALL  Medication Adjustments/Labs and Tests Ordered: Current medicines are reviewed at length with the patient today.  Concerns regarding medicines are outlined above.   Tests Ordered: No orders of the defined types were placed in this encounter.   Medication Changes: No orders of the defined types were placed in this encounter.   Disposition:  Follow up prn  Signed, [name], MD    ATTEMPTED TO CALL PATIENT TWICE TO START VIRTUAL VISIT. I HAVE SINCE SENT MYCHART MESSAGE. HOPEFULLY, SHE WILL RESPOND.

## 2022-11-28 ENCOUNTER — Encounter: Payer: Self-pay | Admitting: Internal Medicine

## 2022-11-30 ENCOUNTER — Encounter: Payer: Self-pay | Admitting: Internal Medicine

## 2022-12-02 ENCOUNTER — Encounter: Payer: Self-pay | Admitting: Internal Medicine

## 2023-01-02 ENCOUNTER — Ambulatory Visit (INDEPENDENT_AMBULATORY_CARE_PROVIDER_SITE_OTHER): Payer: Medicare PPO | Admitting: Internal Medicine

## 2023-01-02 ENCOUNTER — Encounter: Payer: Self-pay | Admitting: Internal Medicine

## 2023-01-02 VITALS — BP 142/80 | HR 98 | Temp 98.7°F | Ht 59.0 in | Wt 137.0 lb

## 2023-01-02 DIAGNOSIS — M1711 Unilateral primary osteoarthritis, right knee: Secondary | ICD-10-CM

## 2023-01-02 DIAGNOSIS — I129 Hypertensive chronic kidney disease with stage 1 through stage 4 chronic kidney disease, or unspecified chronic kidney disease: Secondary | ICD-10-CM

## 2023-01-02 DIAGNOSIS — Z0000 Encounter for general adult medical examination without abnormal findings: Secondary | ICD-10-CM

## 2023-01-02 DIAGNOSIS — Z6827 Body mass index (BMI) 27.0-27.9, adult: Secondary | ICD-10-CM | POA: Diagnosis not present

## 2023-01-02 DIAGNOSIS — N182 Chronic kidney disease, stage 2 (mild): Secondary | ICD-10-CM | POA: Diagnosis not present

## 2023-01-02 DIAGNOSIS — E559 Vitamin D deficiency, unspecified: Secondary | ICD-10-CM | POA: Diagnosis not present

## 2023-01-02 DIAGNOSIS — E663 Overweight: Secondary | ICD-10-CM

## 2023-01-02 LAB — POCT URINALYSIS DIPSTICK
Bilirubin, UA: NEGATIVE
Blood, UA: NEGATIVE
Glucose, UA: NEGATIVE
Leukocytes, UA: NEGATIVE
Nitrite, UA: NEGATIVE
Protein, UA: NEGATIVE
Spec Grav, UA: 1.025 (ref 1.010–1.025)
Urobilinogen, UA: 0.2 E.U./dL
pH, UA: 5.5 (ref 5.0–8.0)

## 2023-01-02 MED ORDER — AMLODIPINE BESYLATE 2.5 MG PO TABS: 2.5000 mg | ORAL_TABLET | Freq: Every day | ORAL | 11 refills | Status: DC

## 2023-01-02 NOTE — Progress Notes (Signed)
Patricia Soto,acting as a Education administrator for Patricia Greenland, MD.,have documented all relevant documentation on the behalf of Patricia Greenland, MD,as directed by  Patricia Greenland, MD while in the presence of Patricia Greenland, MD.   Subjective:     Patient ID: Patricia Soto , female    DOB: 1946/04/22 , 77 y.o.   MRN: [id number]   Chief Complaint  Patient presents with   Annual Exam   Hypertension    HPI  Patient presents today for HM. She is followed by Dr. Dellis Filbert for her GYN exams.  She reports compliance with meds. She denies headaches, chest pain and shortness of breath. Patient had arthroscopic knee surgery on November 9th and she is still having a little stiffness in her knee. She has no new concerns at this time.   BP Readings from Last 3 Encounters: 01/02/23 : (!) 142/80 09/18/22 : (!) 162/78 08/26/22 : 122/78        Hypertension This is a chronic problem. The current episode started more than 1 year ago. The problem has been gradually improving since onset. The problem is controlled. Pertinent negatives include no blurred vision, chest pain, palpitations or shortness of breath. Past treatments include angiotensin blockers and diuretics. The current treatment provides moderate improvement. There are no compliance problems.      Past Medical History:  Diagnosis Date   Anemia    hx teen   Angioedema 05/21/2019   Arthritis    Heart murmur    hx   Hypertension      Family History  Problem Relation Age of Onset   Diabetes Father    Congenital heart disease Father    Hypertension Father    Heart attack Father    Hypertension Sister    Hypertension Sister    Cancer Sister    Hypertension Mother    Other Mother        amyloidosis     Current Outpatient Medications:    amLODipine (NORVASC) 2.5 MG tablet, Take 1 tablet (2.5 mg total) by mouth daily., Disp: 30 tablet, Rfl: 11   Calcium Carbonate (CALCIUM 500 PO), Take 1 tablet by mouth daily. Takes 5 days a week,  Disp: , Rfl:    Cholecalciferol (VITAMIN D3) 250 MCG (10000 UT) capsule, daily. Takes 5 days a week, Disp: , Rfl:    Ginger, Zingiber officinalis, (GINGER ROOT) 550 MG CAPS, Take 1 capsule by mouth daily., Disp: , Rfl:    Magnesium 500 MG CAPS, daily., Disp: , Rfl:    Omega-3 1000 MG CAPS, daily, Disp: , Rfl:    telmisartan (MICARDIS) 40 MG tablet, Take 40 mg by mouth daily., Disp: , Rfl:    Turmeric Curcumin 500 MG CAPS, daily, Disp: , Rfl:    Wheat Dextrin (BENEFIBER ON THE GO) PACK, Take 2 packets by mouth 2 (two) times daily. , Disp: , Rfl:    No Known Allergies    The patient states she uses post menopausal status for birth control. Last LMP was No LMP recorded. Patient is postmenopausal.. Negative for Dysmenorrhea. Negative for: breast discharge, breast lump(s), breast pain and breast self exam. Associated symptoms include abnormal vaginal bleeding. Pertinent negatives include abnormal bleeding (hematology), anxiety, decreased libido, depression, difficulty falling sleep, dyspareunia, history of infertility, nocturia, sexual dysfunction, sleep disturbances, urinary incontinence, urinary urgency, vaginal discharge and vaginal itching. Diet regular.The patient states her exercise level is  usually moderate.  . The patient's tobacco use is:  Social History  Tobacco Use  Smoking Status Never  Smokeless Tobacco Never  . She has been exposed to passive smoke. The patient's alcohol use is:  Social History   Substance and Sexual Activity  Alcohol Use Yes   Alcohol/week: 3.0 - 4.0 standard drinks of alcohol   Types: 3 - 4 Glasses of wine per week   Comment: WINE -     Review of Systems  Constitutional: Negative.   HENT: Negative.    Eyes: Negative.  Negative for blurred vision.  Respiratory: Negative.  Negative for shortness of breath.   Cardiovascular: Negative.  Negative for chest pain and palpitations.  Gastrointestinal: Negative.   Endocrine: Negative.   Genitourinary:  Negative.   Musculoskeletal: Negative.   Skin: Negative.   Allergic/Immunologic: Negative.   Neurological: Negative.   Hematological: Negative.   Psychiatric/Behavioral: Negative.       Today's Vitals   01/02/23 1415 01/02/23 1429  BP: (!) 148/82 (!) 142/80  Pulse: 98   Temp: 98.7 F (37.1 C)   TempSrc: Oral   Weight: 137 lb (62.1 kg)   Height: '4\' 11"'$  (1.499 m)   PainSc: 0-No pain    Body mass index is 27.67 kg/m.  Wt Readings from Last 3 Encounters:  01/02/23 137 lb (62.1 kg)  09/18/22 138 lb 6.4 oz (62.8 kg)  08/26/22 138 lb (62.6 kg)    Objective:  Physical Exam Vitals and nursing note reviewed.  Constitutional:      Appearance: Normal appearance.  HENT:     Head: Normocephalic and atraumatic.     Right Ear: Tympanic membrane, ear canal and external ear normal.     Left Ear: Tympanic membrane, ear canal and external ear normal.     Nose:     Comments: Masked     Mouth/Throat:     Comments: Masked  Eyes:     Extraocular Movements: Extraocular movements intact.     Conjunctiva/sclera: Conjunctivae normal.     Pupils: Pupils are equal, round, and reactive to light.  Cardiovascular:     Rate and Rhythm: Normal rate and regular rhythm.     Pulses: Normal pulses.     Heart sounds: Normal heart sounds.  Pulmonary:     Effort: Pulmonary effort is normal.     Breath sounds: Normal breath sounds.  Chest:  Breasts:    Tanner Score is 5.     Right: Normal.     Left: Normal.  Abdominal:     General: Abdomen is flat. Bowel sounds are normal.     Palpations: Abdomen is soft.  Genitourinary:    Comments: deferred Musculoskeletal:        General: Normal range of motion.     Cervical back: Normal range of motion and neck supple.  Skin:    General: Skin is warm and dry.  Neurological:     General: No focal deficit present.     Mental Status: She is alert and oriented to person, place, and time.  Psychiatric:        Mood and Affect: Mood normal.        Behavior:  Behavior normal.         Assessment And Plan:     1. Routine general medical examination at health care facility Comments: A full exam was performed. Importance of monthly self breast exams was discussed with the patient. She is UTD with CRC and breast CA screening.  PATIENT IS ADVISED TO GET 30-45 MINUTES REGULAR EXERCISE NO LESS THAN FOUR  TO FIVE DAYS PER WEEK - BOTH WEIGHTBEARING EXERCISES AND AEROBIC ARE RECOMMENDED.  PATIENT IS ADVISED TO FOLLOW A HEALTHY DIET WITH AT LEAST SIX FRUITS/VEGGIES PER DAY, DECREASE INTAKE OF RED MEAT, AND TO INCREASE FISH INTAKE TO TWO DAYS PER WEEK.  MEATS/FISH SHOULD NOT BE FRIED, BAKED OR BROILED IS PREFERABLE.  IT IS ALSO IMPORTANT TO CUT BACK ON YOUR SUGAR INTAKE. PLEASE AVOID ANYTHING WITH ADDED SUGAR, CORN SYRUP OR OTHER SWEETENERS. IF YOU MUST USE A SWEETENER, YOU CAN TRY STEVIA. IT IS ALSO IMPORTANT TO AVOID ARTIFICIALLY SWEETENERS AND DIET BEVERAGES. LASTLY, I SUGGEST WEARING SPF 50 SUNSCREEN ON EXPOSED PARTS AND ESPECIALLY WHEN IN THE DIRECT SUNLIGHT FOR AN EXTENDED PERIOD OF TIME.  PLEASE AVOID FAST FOOD RESTAURANTS AND INCREASE YOUR WATER INTAKE.  2. Hypertensive nephropathy Comments: Chronic, uncontrolled. I will add amlodipine 2.'5mg'$  to the telmisartan '40mg'$  she is taking. EKG performed, NSR w/o acute changes. F/u 2 wks for a nurse vsiit.  - EKG 12-Lead - Microalbumin / creatinine urine ratio - POCT urinalysis dipstick - CMP14+EGFR - CBC - Lipid panel - amLODipine (NORVASC) 2.5 MG tablet; Take 1 tablet (2.5 mg total) by mouth daily.  Dispense: 30 tablet; Refill: 11 - TSH  3. Chronic renal disease, stage II Comments: Chronic, encouraged to stay well hydrated and keep BP well controlled to decrease risk of CKD progression.  4. Vitamin D deficiency disease Comments: I will check vitamin D level and supplement as needed. - VITAMIN D 25 Hydroxy (Vit-D Deficiency, Fractures)  5. Overweight with body mass index (BMI) of 27 to 27.9 in  adult Comments: She is encouraged to gradually increase daily activity, aiming for at least 150 minutes of exercise/week.   Patient was given opportunity to ask questions. Patient verbalized understanding of the plan and was able to repeat key elements of the plan. All questions were answered to their satisfaction.   I, Patricia Greenland, MD, have reviewed all documentation for this visit. The documentation on 01/02/23 for the exam, diagnosis, procedures, and orders are all accurate and complete.   THE PATIENT IS ENCOURAGED TO PRACTICE SOCIAL DISTANCING DUE TO THE COVID-19 PANDEMIC.

## 2023-01-02 NOTE — Patient Instructions (Addendum)
Tart cherry juice  Start amlodipine 2.68m nightly for blood pressure Return in two weeks for bp check We will adjust dose at that time if needed.  Health Maintenance, Female Adopting a healthy lifestyle and getting preventive care are important in promoting health and wellness. Ask your health care provider about: The right schedule for you to have regular tests and exams. Things you can do on your own to prevent diseases and keep yourself healthy. What should I know about diet, weight, and exercise? Eat a healthy diet  Eat a diet that includes plenty of vegetables, fruits, low-fat dairy products, and lean protein. Do not eat a lot of foods that are high in solid fats, added sugars, or sodium. Maintain a healthy weight Body mass index (BMI) is used to identify weight problems. It estimates body fat based on height and weight. Your health care provider can help determine your BMI and help you achieve or maintain a healthy weight. Get regular exercise Get regular exercise. This is one of the most important things you can do for your health. Most adults should: Exercise for at least 150 minutes each week. The exercise should increase your heart rate and make you sweat (moderate-intensity exercise). Do strengthening exercises at least twice a week. This is in addition to the moderate-intensity exercise. Spend less time sitting. Even light physical activity can be beneficial. Watch cholesterol and blood lipids Have your blood tested for lipids and cholesterol at 77years of age, then have this test every 5 years. Have your cholesterol levels checked more often if: Your lipid or cholesterol levels are high. You are older than 77years of age. You are at high risk for heart disease. What should I know about cancer screening? Depending on your health history and family history, you may need to have cancer screening at various ages. This may include screening for: Breast cancer. Cervical  cancer. Colorectal cancer. Skin cancer. Lung cancer. What should I know about heart disease, diabetes, and high blood pressure? Blood pressure and heart disease High blood pressure causes heart disease and increases the risk of stroke. This is more likely to develop in people who have high blood pressure readings or are overweight. Have your blood pressure checked: Every 3-5 years if you are [id number] of age. Every year if you are 480years old or older. Diabetes Have regular diabetes screenings. This checks your fasting blood sugar level. Have the screening done: Once every three years after age 4465if you are at a normal weight and have a low risk for diabetes. More often and at a younger age if you are overweight or have a high risk for diabetes. What should I know about preventing infection? Hepatitis B If you have a higher risk for hepatitis B, you should be screened for this virus. Talk with your health care provider to find out if you are at risk for hepatitis B infection. Hepatitis C Testing is recommended for: Everyone born from [birth year]through 1965. Anyone with known risk factors for hepatitis C. Sexually transmitted infections (STIs) Get screened for STIs, including gonorrhea and chlamydia, if: You are sexually active and are younger than 77years of age. You are older than 77years of age and your health care provider tells you that you are at risk for this type of infection. Your sexual activity has changed since you were last screened, and you are at increased risk for chlamydia or gonorrhea. Ask your health care provider if you are at  risk. Ask your health care provider about whether you are at high risk for HIV. Your health care provider may recommend a prescription medicine to help prevent HIV infection. If you choose to take medicine to prevent HIV, you should first get tested for HIV. You should then be tested every 3 months for as long as you are taking the  medicine. Pregnancy If you are about to stop having your period (premenopausal) and you may become pregnant, seek counseling before you get pregnant. Take 400 to 800 micrograms (mcg) of folic acid every day if you become pregnant. Ask for birth control (contraception) if you want to prevent pregnancy. Osteoporosis and menopause Osteoporosis is a disease in which the bones lose minerals and strength with aging. This can result in bone fractures. If you are 31 years old or older, or if you are at risk for osteoporosis and fractures, ask your health care provider if you should: Be screened for bone loss. Take a calcium or vitamin D supplement to lower your risk of fractures. Be given hormone replacement therapy (HRT) to treat symptoms of menopause. Follow these instructions at home: Alcohol use Do not drink alcohol if: Your health care provider tells you not to drink. You are pregnant, may be pregnant, or are planning to become pregnant. If you drink alcohol: Limit how much you have to: 0-1 drink a day. Know how much alcohol is in your drink. In the [place] one drink equals one 12 oz bottle of beer (355 mL), one 5 oz glass of wine (148 mL), or one 1 oz glass of hard liquor (44 mL). Lifestyle Do not use any products that contain nicotine or tobacco. These products include cigarettes, chewing tobacco, and vaping devices, such as e-cigarettes. If you need help quitting, ask your health care provider. Do not use street drugs. Do not share needles. Ask your health care provider for help if you need support or information about quitting drugs. General instructions Schedule regular health, dental, and eye exams. Stay current with your vaccines. Tell your health care provider if: You often feel depressed. You have ever been abused or do not feel safe at home. Summary Adopting a healthy lifestyle and getting preventive care are important in promoting health and wellness. Follow your health care  provider's instructions about healthy diet, exercising, and getting tested or screened for diseases. Follow your health care provider's instructions on monitoring your cholesterol and blood pressure. This information is not intended to replace advice given to you by your health care provider. Make sure you discuss any questions you have with your health care provider. Document Revised: 03/19/2021 Document Reviewed: 03/19/2021 Elsevier Patient Education  [place].

## 2023-01-03 LAB — CMP14+EGFR
ALT: 12 IU/L (ref 0–32)
AST: 20 IU/L (ref 0–40)
Albumin/Globulin Ratio: 1.6 (ref 1.2–2.2)
Albumin: 4.2 g/dL (ref 3.8–4.8)
Alkaline Phosphatase: 60 IU/L (ref 44–121)
BUN/Creatinine Ratio: 17 (ref 12–28)
BUN: 15 mg/dL (ref 8–27)
Bilirubin Total: 0.3 mg/dL (ref 0.0–1.2)
CO2: 25 mmol/L (ref 20–29)
Calcium: 9.7 mg/dL (ref 8.7–10.3)
Chloride: 105 mmol/L (ref 96–106)
Creatinine, Ser: 0.86 mg/dL (ref 0.57–1.00)
Globulin, Total: 2.6 g/dL (ref 1.5–4.5)
Glucose: 88 mg/dL (ref 70–99)
Potassium: 3.9 mmol/L (ref 3.5–5.2)
Sodium: 144 mmol/L (ref 134–144)
Total Protein: 6.8 g/dL (ref 6.0–8.5)
eGFR: 70 mL/min/{1.73_m2} (ref >59)

## 2023-01-03 LAB — CBC
Hematocrit: 31.9 % — ABNORMAL LOW (ref 34.0–46.6)
Hemoglobin: 10.8 g/dL — ABNORMAL LOW (ref 11.1–15.9)
MCH: 32 pg (ref 26.6–33.0)
MCHC: 33.9 g/dL (ref 31.5–35.7)
MCV: 95 fL (ref 79–97)
Platelets: 191 10*3/uL (ref 150–450)
RBC: 3.37 x10E6/uL — ABNORMAL LOW (ref 3.77–5.28)
RDW: 11.9 % (ref 11.7–15.4)
WBC: 3.6 10*3/uL (ref 3.4–10.8)

## 2023-01-03 LAB — MICROALBUMIN / CREATININE URINE RATIO
Creatinine, Urine: 185 mg/dL
Microalb/Creat Ratio: 7 mg/g creat (ref 0–29)
Microalbumin, Urine: 12.6 ug/mL

## 2023-01-03 LAB — LIPID PANEL
Chol/HDL Ratio: 3 ratio (ref 0.0–4.4)
Cholesterol, Total: 200 mg/dL — ABNORMAL HIGH (ref 100–199)
HDL: 66 mg/dL (ref >39)
LDL Chol Calc (NIH): 117 mg/dL — ABNORMAL HIGH (ref 0–99)
Triglycerides: 95 mg/dL (ref 0–149)
VLDL Cholesterol Cal: 17 mg/dL (ref 5–40)

## 2023-01-03 LAB — VITAMIN D 25 HYDROXY (VIT D DEFICIENCY, FRACTURES): Vit D, 25-Hydroxy: 103 ng/mL — ABNORMAL HIGH (ref 30.0–100.0)

## 2023-01-03 LAB — TSH: TSH: 1.89 u[IU]/mL (ref 0.450–4.500)

## 2023-01-21 ENCOUNTER — Ambulatory Visit: Payer: Medicare PPO

## 2023-01-21 VITALS — BP 140/72 | HR 90 | Temp 98.5°F | Ht 59.0 in | Wt 137.0 lb

## 2023-01-21 DIAGNOSIS — I129 Hypertensive chronic kidney disease with stage 1 through stage 4 chronic kidney disease, or unspecified chronic kidney disease: Secondary | ICD-10-CM

## 2023-01-21 NOTE — Progress Notes (Signed)
Patient presents today for a BP check, patient currently taking amLODipine 2.'5mg'$  PM and telmisartan '40mg'$ .  BP Readings from Last 3 Encounters:  01/21/23 (!) 140/80  01/02/23 (!) 142/80  09/18/22 (!) 162/78  Provider- follow up in 4w

## 2023-01-21 NOTE — Patient Instructions (Signed)
Hypertension, Adult ?Hypertension is another name for high blood pressure. High blood pressure forces your heart to work harder to pump blood. This can cause problems over time. ?There are two numbers in a blood pressure reading. There is a top number (systolic) over a bottom number (diastolic). It is best to have a blood pressure that is below 120/80. ?What are the causes? ?The cause of this condition is not known. Some other conditions can lead to high blood pressure. ?What increases the risk? ?Some lifestyle factors can make you more likely to develop high blood pressure: ?Smoking. ?Not getting enough exercise or physical activity. ?Being overweight. ?Having too much fat, sugar, calories, or salt (sodium) in your diet. ?Drinking too much alcohol. ?Other risk factors include: ?Having any of these conditions: ?Heart disease. ?Diabetes. ?High cholesterol. ?Kidney disease. ?Obstructive sleep apnea. ?Having a family history of high blood pressure and high cholesterol. ?Age. The risk increases with age. ?Stress. ?What are the signs or symptoms? ?High blood pressure may not cause symptoms. Very high blood pressure (hypertensive crisis) may cause: ?Headache. ?Fast or uneven heartbeats (palpitations). ?Shortness of breath. ?Nosebleed. ?Vomiting or feeling like you may vomit (nauseous). ?Changes in how you see. ?Very bad chest pain. ?Feeling dizzy. ?Seizures. ?How is this treated? ?This condition is treated by making healthy lifestyle changes, such as: ?Eating healthy foods. ?Exercising more. ?Drinking less alcohol. ?Your doctor may prescribe medicine if lifestyle changes do not help enough and if: ?Your top number is above 130. ?Your bottom number is above 80. ?Your personal target blood pressure may vary. ?Follow these instructions at home: ?Eating and drinking ? ?If told, follow the DASH eating plan. To follow this plan: ?Fill one half of your plate at each meal with fruits and vegetables. ?Fill one fourth of your plate  at each meal with whole grains. Whole grains include whole-wheat pasta, brown rice, and whole-grain bread. ?Eat or drink low-fat dairy products, such as skim milk or low-fat yogurt. ?Fill one fourth of your plate at each meal with low-fat (lean) proteins. Low-fat proteins include fish, chicken without skin, eggs, beans, and tofu. ?Avoid fatty meat, cured and processed meat, or chicken with skin. ?Avoid pre-made or processed food. ?Limit the amount of salt in your diet to less than 1,500 mg each day. ?Do not drink alcohol if: ?Your doctor tells you not to drink. ?You are pregnant, may be pregnant, or are planning to become pregnant. ?If you drink alcohol: ?Limit how much you have to: ?0-1 drink a day for women. ?0-2 drinks a day for men. ?Know how much alcohol is in your drink. In the [place] one drink equals one 12 oz bottle of beer (355 mL), one 5 oz glass of wine (148 mL), or one 1? oz glass of hard liquor (44 mL). ?Lifestyle ? ?Work with your doctor to stay at a healthy weight or to lose weight. Ask your doctor what the best weight is for you. ?Get at least 30 minutes of exercise that causes your heart to beat faster (aerobic exercise) most days of the week. This may include walking, swimming, or biking. ?Get at least 30 minutes of exercise that strengthens your muscles (resistance exercise) at least 3 days a week. This may include lifting weights or doing Pilates. ?Do not smoke or use any products that contain nicotine or tobacco. If you need help quitting, ask your doctor. ?Check your blood pressure at home as told by your doctor. ?Keep all follow-up visits. ?Medicines ?Take over-the-counter and prescription medicines   only as told by your doctor. Follow directions carefully. ?Do not skip doses of blood pressure medicine. The medicine does not work as well if you skip doses. Skipping doses also puts you at risk for problems. ?Ask your doctor about side effects or reactions to medicines that you should watch  for. ?Contact a doctor if: ?You think you are having a reaction to the medicine you are taking. ?You have headaches that keep coming back. ?You feel dizzy. ?You have swelling in your ankles. ?You have trouble with your vision. ?Get help right away if: ?You get a very bad headache. ?You start to feel mixed up (confused). ?You feel weak or numb. ?You feel faint. ?You have very bad pain in your: ?Chest. ?Belly (abdomen). ?You vomit more than once. ?You have trouble breathing. ?These symptoms may be an emergency. Get help right away. Call 911. ?Do not wait to see if the symptoms will go away. ?Do not drive yourself to the hospital. ?Summary ?Hypertension is another name for high blood pressure. ?High blood pressure forces your heart to work harder to pump blood. ?For most people, a normal blood pressure is less than 120/80. ?Making healthy choices can help lower blood pressure. If your blood pressure does not get lower with healthy choices, you may need to take medicine. ?This information is not intended to replace advice given to you by your health care provider. Make sure you discuss any questions you have with your health care provider. ?Document Revised: 08/16/2021 Document Reviewed: 08/16/2021 ?Elsevier Patient Education ? 2023 Elsevier Inc. ? ?

## 2023-02-18 ENCOUNTER — Ambulatory Visit: Payer: Self-pay

## 2023-02-20 ENCOUNTER — Ambulatory Visit: Payer: Medicare PPO

## 2023-02-20 NOTE — Progress Notes (Signed)
Reschedule

## 2023-02-25 ENCOUNTER — Ambulatory Visit: Payer: Medicare PPO

## 2023-02-25 VITALS — BP 118/64 | HR 98 | Temp 98.5°F | Ht 59.0 in | Wt 137.0 lb

## 2023-02-25 DIAGNOSIS — I129 Hypertensive chronic kidney disease with stage 1 through stage 4 chronic kidney disease, or unspecified chronic kidney disease: Secondary | ICD-10-CM

## 2023-02-25 NOTE — Progress Notes (Signed)
Patient presents today for a BP check, patient currently taking amLODipine 2.5mg  PM, telmisartan  AM.  BP Readings from Last 3 Encounters:  02/25/23 118/68  01/21/23 (!) 140/72  01/02/23 (!) 142/80  Per provider- BP is much better, f/u next visit.

## 2023-05-12 DIAGNOSIS — T63481A Toxic effect of venom of other arthropod, accidental (unintentional), initial encounter: Secondary | ICD-10-CM | POA: Diagnosis not present

## 2023-05-12 DIAGNOSIS — I10 Essential (primary) hypertension: Secondary | ICD-10-CM | POA: Diagnosis not present

## 2023-05-12 DIAGNOSIS — L089 Local infection of the skin and subcutaneous tissue, unspecified: Secondary | ICD-10-CM | POA: Diagnosis not present

## 2023-05-14 ENCOUNTER — Other Ambulatory Visit: Payer: Self-pay | Admitting: Internal Medicine

## 2023-05-14 DIAGNOSIS — Z1231 Encounter for screening mammogram for malignant neoplasm of breast: Secondary | ICD-10-CM

## 2023-06-05 ENCOUNTER — Ambulatory Visit: Payer: Medicare PPO | Admitting: Internal Medicine

## 2023-06-18 ENCOUNTER — Ambulatory Visit (INDEPENDENT_AMBULATORY_CARE_PROVIDER_SITE_OTHER): Payer: Medicare PPO

## 2023-06-18 VITALS — BP 112/60 | HR 78 | Temp 98.4°F | Ht 58.6 in | Wt 140.4 lb

## 2023-06-18 DIAGNOSIS — Z0000 Encounter for general adult medical examination without abnormal findings: Secondary | ICD-10-CM

## 2023-06-18 NOTE — Patient Instructions (Signed)
[name] , Thank you for taking time to come for your Medicare Wellness Visit. I appreciate your ongoing commitment to your health goals. Please review the following plan we discussed and let me know if I can assist you in the future.   Referrals/Orders/Follow-Ups/Clinician Recommendations: none  This is a list of the screening recommended for you and due dates:  Health Maintenance  Topic Date Due   COVID-19 Vaccine (6 - 2023-24 season) 07/12/2022   Flu Shot  06/12/2023   Medicare Annual Wellness Visit  06/17/2024   DTaP/Tdap/Td vaccine (3 - Td or Tdap) 04/27/2030   Pneumonia Vaccine  Completed   DEXA scan (bone density measurement)  Completed   Hepatitis C Screening  Completed   Zoster (Shingles) Vaccine  Completed   HPV Vaccine  Aged Out   Colon Cancer Screening  Discontinued    Advanced directives: (Copy Requested) Please bring a copy of your health care power of attorney and living will to the office to be added to your chart at your convenience.  Next Medicare Annual Wellness Visit scheduled for next year: No, office will schedule  Preventive Care 65 Years and Older, Female Preventive care refers to lifestyle choices and visits with your health care provider that can promote health and wellness. What does preventive care include? A yearly physical exam. This is also called an annual well check. Dental exams once or twice a year. Routine eye exams. Ask your health care provider how often you should have your eyes checked. Personal lifestyle choices, including: Daily care of your teeth and gums. Regular physical activity. Eating a healthy diet. Avoiding tobacco and drug use. Limiting alcohol use. Practicing safe sex. Taking low-dose aspirin every day. Taking vitamin and mineral supplements as recommended by your health care provider. What happens during an annual well check? The services and screenings done by your health care provider during your annual well check will  depend on your age, overall health, lifestyle risk factors, and family history of disease. Counseling  Your health care provider may ask you questions about your: Alcohol use. Tobacco use. Drug use. Emotional well-being. Home and relationship well-being. Sexual activity. Eating habits. History of falls. Memory and ability to understand (cognition). Work and work Astronomer. Reproductive health. Screening  You may have the following tests or measurements: Height, weight, and BMI. Blood pressure. Lipid and cholesterol levels. These may be checked every 5 years, or more frequently if you are over 81 years old. Skin check. Lung cancer screening. You may have this screening every year starting at age 80 if you have a 30-pack-year history of smoking and currently smoke or have quit within the past 15 years. Fecal occult blood test (FOBT) of the stool. You may have this test every year starting at age 47. Flexible sigmoidoscopy or colonoscopy. You may have a sigmoidoscopy every 5 years or a colonoscopy every 10 years starting at age 23. Hepatitis C blood test. Hepatitis B blood test. Sexually transmitted disease (STD) testing. Diabetes screening. This is done by checking your blood sugar (glucose) after you have not eaten for a while (fasting). You may have this done every 1-3 years. Bone density scan. This is done to screen for osteoporosis. You may have this done starting at age 73. Mammogram. This may be done every 1-2 years. Talk to your health care provider about how often you should have regular mammograms. Talk with your health care provider about your test results, treatment options, and if necessary, the need for more  tests. Vaccines  Your health care provider may recommend certain vaccines, such as: Influenza vaccine. This is recommended every year. Tetanus, diphtheria, and acellular pertussis (Tdap, Td) vaccine. You may need a Td booster every 10 years. Zoster vaccine. You may  need this after age 53. Pneumococcal 13-valent conjugate (PCV13) vaccine. One dose is recommended after age 20. Pneumococcal polysaccharide (PPSV23) vaccine. One dose is recommended after age 75. Talk to your health care provider about which screenings and vaccines you need and how often you need them. This information is not intended to replace advice given to you by your health care provider. Make sure you discuss any questions you have with your health care provider. Document Released: 11/24/2015 Document Revised: 07/17/2016 Document Reviewed: 08/29/2015 Elsevier Interactive Patient Education  2017 [employer].  Fall Prevention in the Home Falls can cause injuries. They can happen to people of all ages. There are many things you can do to make your home safe and to help prevent falls. What can I do on the outside of my home? Regularly fix the edges of walkways and driveways and fix any cracks. Remove anything that might make you trip as you walk through a door, such as a raised step or threshold. Trim any bushes or trees on the path to your home. Use bright outdoor lighting. Clear any walking paths of anything that might make someone trip, such as rocks or tools. Regularly check to see if handrails are loose or broken. Make sure that both sides of any steps have handrails. Any raised decks and porches should have guardrails on the edges. Have any leaves, snow, or ice cleared regularly. Use sand or salt on walking paths during winter. Clean up any spills in your garage right away. This includes oil or grease spills. What can I do in the bathroom? Use night lights. Install grab bars by the toilet and in the tub and shower. Do not use towel bars as grab bars. Use non-skid mats or decals in the tub or shower. If you need to sit down in the shower, use a plastic, non-slip stool. Keep the floor dry. Clean up any water that spills on the floor as soon as it happens. Remove soap buildup in  the tub or shower regularly. Attach bath mats securely with double-sided non-slip rug tape. Do not have throw rugs and other things on the floor that can make you trip. What can I do in the bedroom? Use night lights. Make sure that you have a light by your bed that is easy to reach. Do not use any sheets or blankets that are too big for your bed. They should not hang down onto the floor. Have a firm chair that has side arms. You can use this for support while you get dressed. Do not have throw rugs and other things on the floor that can make you trip. What can I do in the kitchen? Clean up any spills right away. Avoid walking on wet floors. Keep items that you use a lot in easy-to-reach places. If you need to reach something above you, use a strong step stool that has a grab bar. Keep electrical cords out of the way. Do not use floor polish or wax that makes floors slippery. If you must use wax, use non-skid floor wax. Do not have throw rugs and other things on the floor that can make you trip. What can I do with my stairs? Do not leave any items on the stairs. Make sure  that there are handrails on both sides of the stairs and use them. Fix handrails that are broken or loose. Make sure that handrails are as long as the stairways. Check any carpeting to make sure that it is firmly attached to the stairs. Fix any carpet that is loose or worn. Avoid having throw rugs at the top or bottom of the stairs. If you do have throw rugs, attach them to the floor with carpet tape. Make sure that you have a light switch at the top of the stairs and the bottom of the stairs. If you do not have them, ask someone to add them for you. What else can I do to help prevent falls? Wear shoes that: Do not have high heels. Have rubber bottoms. Are comfortable and fit you well. Are closed at the toe. Do not wear sandals. If you use a stepladder: Make sure that it is fully opened. Do not climb a closed  stepladder. Make sure that both sides of the stepladder are locked into place. Ask someone to hold it for you, if possible. Clearly mark and make sure that you can see: Any grab bars or handrails. First and last steps. Where the edge of each step is. Use tools that help you move around (mobility aids) if they are needed. These include: Canes. Walkers. Scooters. Crutches. Turn on the lights when you go into a dark area. Replace any light bulbs as soon as they burn out. Set up your furniture so you have a clear path. Avoid moving your furniture around. If any of your floors are uneven, fix them. If there are any pets around you, be aware of where they are. Review your medicines with your doctor. Some medicines can make you feel dizzy. This can increase your chance of falling. Ask your doctor what other things that you can do to help prevent falls. This information is not intended to replace advice given to you by your health care provider. Make sure you discuss any questions you have with your health care provider. Document Released: 08/24/2009 Document Revised: 04/04/2016 Document Reviewed: 12/02/2014 Elsevier Interactive Patient Education  2017 [employer].

## 2023-06-18 NOTE — Progress Notes (Signed)
Subjective:   Patricia Soto is a 77 y.o. female who presents for Medicare Annual (Subsequent) preventive examination.  Visit Complete: In person  Patient Medicare AWV questionnaire was completed by the patient on 06/14/2023; I have confirmed that all information answered by patient is correct and no changes since this date.  Review of Systems     Cardiac Risk Factors include: advanced age (>34men, >8 women);hypertension     Objective:    Today's Vitals   06/18/23 1145  BP: 112/60  Pulse: 78  Temp: 98.4 F (36.9 C)  TempSrc: Oral  SpO2: 99%  Weight: 140 lb 6.4 oz (63.7 kg)  Height: 4' 10.6" (1.488 m)   Body mass index is 28.75 kg/m.     06/18/2023   11:52 AM 05/30/2022    2:14 PM 04/25/2021    2:22 PM 04/13/2020    2:19 PM 06/17/2019    3:59 PM 10/01/2018    2:28 PM 10/30/2015    6:48 AM  Advanced Directives  Does Patient Have a Medical Advance Directive? Yes Yes Yes Yes Yes Yes   Type of Estate agent of [place];Living will Healthcare Power of [place];Living will Healthcare Power of [place];Living will Healthcare Power of [place];Living will Living will Living will   Does patient want to make changes to medical advance directive?      No - Patient declined   Copy of Healthcare Power of Attorney in Chart? No - copy requested No - copy requested No - copy requested No - copy requested   No - copy requested    Current Medications (verified) Outpatient Encounter Medications as of 06/18/2023  Medication Sig   amLODipine (NORVASC) 2.5 MG tablet Take 1 tablet (2.5 mg total) by mouth daily.   Calcium Carbonate (CALCIUM 500 PO) Take 1 tablet by mouth daily. Takes 5 days a week   Cholecalciferol (VITAMIN D3) 250 MCG (10000 UT) capsule daily. Takes 5 days a week   Ginger, Zingiber officinalis, (GINGER ROOT) 550 MG CAPS Take 1 capsule by mouth daily.   Magnesium 500 MG CAPS daily.   Omega-3 1000 MG CAPS daily   telmisartan (MICARDIS) 40 MG tablet Take 40 mg  by mouth daily.   Turmeric Curcumin 500 MG CAPS daily   Wheat Dextrin (BENEFIBER ON THE GO) PACK Take 2 packets by mouth 2 (two) times daily.    No facility-administered encounter medications on file as of 06/18/2023.    Allergies (verified) Patient has no known allergies.   History: Past Medical History:  Diagnosis Date   Allergy 05/10/2023   Wasp sting   Anemia    hx teen   Angioedema 05/21/2019   Arthritis    Heart murmur    hx   Hypertension    Past Surgical History:  Procedure Laterality Date   COLPOSCOPY  1991   FOOT SURGERY Bilateral    [id number],2013 rt,94 ,2012,lft [id number]   KNEE ARTHROSCOPY  09/19/2022   Dr. Eulah Pont   MYOMECTOMY  1990   PARATHYROIDECTOMY N/A 10/30/2015   Procedure: PARATHYROIDECTOMY;  Surgeon: Darnell Level, MD;  Location: [facility];  Service: General;  Laterality: N/A;   Family History  Problem Relation Age of Onset   Diabetes Father    Congenital heart disease Father    Hypertension Father    Heart attack Father    Heart disease Father    Hypertension Sister    Hypertension Sister    Cancer Sister    Hypertension Mother    Other Mother  amyloidosis   Social History   Socioeconomic History   Marital status: Single    Spouse name: Not on file   Number of children: Not on file   Years of education: Not on file   Highest education level: Not on file  Occupational History   Occupation: retired  Tobacco Use   Smoking status: Never   Smokeless tobacco: Never  Vaping Use   Vaping status: Never Used  Substance and Sexual Activity   Alcohol use: Yes    Alcohol/week: 2.0 - 3.0 standard drinks of alcohol    Types: 2 - 3 Glasses of wine per week    Comment: WINE -    Drug use: No   Sexual activity: Not Currently    Partners: Male    Comment: 1ST  intercourse- 26, partners - refused to answer   Other Topics Concern   Not on file  Social History Narrative   Not on file   Social Determinants of Health   Financial Resource  Strain: Low Risk  (06/14/2023)   Overall Financial Resource Strain (CARDIA)    Difficulty of Paying Living Expenses: Not hard at all  Food Insecurity: No Food Insecurity (06/14/2023)   Hunger Vital Sign    Worried About Running Out of Food in the Last Year: Never true    Ran Out of Food in the Last Year: Never true  Transportation Needs: No Transportation Needs (06/14/2023)   PRAPARE - Administrator, Civil Service (Medical): No    Lack of Transportation (Non-Medical): No  Physical Activity: Sufficiently Active (06/14/2023)   Exercise Vital Sign    Days of Exercise per Week: 5 days    Minutes of Exercise per Session: 50 min  Stress: No Stress Concern Present (06/14/2023)   [employer] of Occupational Health - Occupational Stress Questionnaire    Feeling of Stress : Only a little  Social Connections: Unknown (06/14/2023)   Social Connection and Isolation Panel [NHANES]    Frequency of Communication with Friends and Family: More than three times a week    Frequency of Social Gatherings with Friends and Family: Three times a week    Attends Religious Services: Not on file    Active Member of Clubs or Organizations: Yes    Attends Banker Meetings: More than 4 times per year    Marital Status: Never married    Tobacco Counseling Counseling given: Not Answered   Clinical Intake:  Pre-visit preparation completed: Yes  Pain : No/denies pain     Nutritional Risks: None Diabetes: No  How often do you need to have someone help you when you read instructions, pamphlets, or other written materials from your doctor or pharmacy?: 1 - Never  Interpreter Needed?: No  Information entered by :: NAllen LPN   Activities of Daily Living    06/14/2023   11:47 AM  In your present state of health, do you have any difficulty performing the following activities:  Hearing? 0  Vision? 0  Difficulty concentrating or making decisions? 0  Walking or climbing stairs? 1   Comment due to stiff knee  Dressing or bathing? 0  Doing errands, shopping? 0  Preparing Food and eating ? N  Using the Toilet? N  In the past six months, have you accidently leaked urine? Y  Comment been happening for years  Do you have problems with loss of bowel control? N  Managing your Medications? N  Managing your Finances? N  Housekeeping or  managing your Housekeeping? N    Patient Care Team: Dorothyann Peng, MD as PCP - General (Internal Medicine) Pa, [facility] Karma Greaser, Ashby Dawes, MD as Consulting Physician (Obstetrics and Gynecology)  Indicate any recent Medical Services you may have received from other than [facility] providers in the past year (date may be approximate).     Assessment:   This is a routine wellness examination for Jazma.  Hearing/Vision screen Hearing Screening - Comments:: Denies hearing issues Vision Screening - Comments:: Regular eye exams, [facility]  Dietary issues and exercise activities discussed:     Goals Addressed             This Visit's Progress    Patient Stated       06/18/2023, wants to lose weight       Depression Screen    06/18/2023   11:57 AM 01/02/2023    2:15 PM 09/18/2022    4:32 PM 08/01/2022    2:55 PM 05/30/2022    2:15 PM 04/25/2021    2:24 PM 04/13/2020    2:20 PM  PHQ 2/9 Scores  PHQ - 2 Score 0 0 0 0 0 0 0  PHQ- 9 Score 0          Fall Risk    06/14/2023   11:47 AM 01/02/2023    2:15 PM 09/18/2022    4:32 PM 08/01/2022    2:55 PM 05/30/2022    2:15 PM  Fall Risk   Falls in the past year? 0 0 0 0 0  Number falls in past yr: 0 0 0 0 0  Injury with Fall? 0 0 0 0 0  Risk for fall due to : Medication side effect No Fall Risks No Fall Risks No Fall Risks Medication side effect  Follow up Falls prevention discussed;Falls evaluation completed Falls evaluation completed Falls evaluation completed Falls evaluation completed Falls evaluation completed;Education provided;Falls prevention discussed     MEDICARE RISK AT HOME:  Medicare Risk at Home - 06/18/23 1156     Any stairs in or around the home? Yes    If so, are there any without handrails? Yes    Home free of loose throw rugs in walkways, pet beds, electrical cords, etc? No    Adequate lighting in your home to reduce risk of falls? Yes    Life alert? No    Use of a cane, walker or w/c? No    Grab bars in the bathroom? Yes    Shower chair or bench in shower? No    Elevated toilet seat or a handicapped toilet? Yes             TIMED UP AND GO:  Was the test performed?  Yes  Length of time to ambulate 10 feet: 5 sec Gait steady and fast without use of assistive device    Cognitive Function:        06/18/2023   11:59 AM 05/30/2022    2:18 PM 04/25/2021    2:25 PM 04/13/2020    2:22 PM 06/17/2019    4:06 PM  6CIT Screen  What Year? 0 points 0 points 0 points 0 points 0 points  What month? 0 points 0 points 0 points 0 points 0 points  What time? 0 points 0 points 0 points 0 points 0 points  Count back from 20 0 points 0 points 0 points 0 points 0 points  Months in reverse 0 points 0 points 0 points 2 points 0  points  Repeat phrase 0 points 0 points 0 points 0 points 0 points  Total Score 0 points 0 points 0 points 2 points 0 points    Immunizations Immunization History  Administered Date(s) Administered   Fluad Quad(high Dose 65+) 08/01/2022   Influenza, High Dose Seasonal PF 08/29/2018, 08/23/2019, 07/26/2020, 07/27/2020, 08/09/2021   Influenza,inj,Quad PF,6+ Mos 07/27/2020   Influenza-Unspecified 08/23/2019   Moderna Sars-Covid-2 Vaccination 01/03/2020, 02/01/2020, 09/14/2020, 05/01/2021, 09/03/2021   Pneumococcal Conjugate-13 06/08/2015   Pneumococcal Polysaccharide-23 04/29/2014   Tdap 11/20/2009, 04/27/2020   Zoster Recombinant(Shingrix) 11/24/2021, 02/01/2022   Zoster, Live 05/24/2014    TDAP status: Up to date  Flu Vaccine status: Due, Education has been provided regarding the importance of this  vaccine. Advised may receive this vaccine at local pharmacy or Health Dept. Aware to provide a copy of the vaccination record if obtained from local pharmacy or Health Dept. Verbalized acceptance and understanding.  Pneumococcal vaccine status: Up to date  Covid-19 vaccine status: Information provided on how to obtain vaccines.   Qualifies for Shingles Vaccine? Yes   Zostavax completed Yes   Shingrix Completed?: Yes  Screening Tests Health Maintenance  Topic Date Due   COVID-19 Vaccine (6 - 2023-24 season) 07/12/2022   INFLUENZA VACCINE  06/12/2023   Medicare Annual Wellness (AWV)  06/17/2024   DTaP/Tdap/Td (3 - Td or Tdap) 04/27/2030   Pneumonia Vaccine 9+ Years old  Completed   DEXA SCAN  Completed   Hepatitis C Screening  Completed   Zoster Vaccines- Shingrix  Completed   HPV VACCINES  Aged Out   Colonoscopy  Discontinued    Health Maintenance  Health Maintenance Due  Topic Date Due   COVID-19 Vaccine (6 - 2023-24 season) 07/12/2022   INFLUENZA VACCINE  06/12/2023    Colorectal cancer screening: No longer required.   Mammogram status: scheduled for 07/02/2023  Bone Density status: Completed 12/27/2020.  Lung Cancer Screening: (Low Dose CT Chest recommended if Age 29-80 years, 20 pack-year currently smoking OR have quit w/in 15years.) does not qualify.   Lung Cancer Screening Referral: no  Additional Screening:  Hepatitis C Screening: does qualify; Completed 04/08/2019  Vision Screening: Recommended annual ophthalmology exams for early detection of glaucoma and other disorders of the eye. Is the patient up to date with their annual eye exam?  Yes  Who is the provider or what is the name of the office in which the patient attends annual eye exams? [facility] If pt is not established with a provider, would they like to be referred to a provider to establish care? No .   Dental Screening: Recommended annual dental exams for proper oral hygiene  Diabetic Foot  Exam: n/a  Community Resource Referral / Chronic Care Management: CRR required this visit?  No   CCM required this visit?  No     Plan:     I have personally reviewed and noted the following in the patient's chart:   Medical and social history Use of alcohol, tobacco or illicit drugs  Current medications and supplements including opioid prescriptions. Patient is not currently taking opioid prescriptions. Functional ability and status Nutritional status Physical activity Advanced directives List of other physicians Hospitalizations, surgeries, and ER visits in previous 12 months Vitals Screenings to include cognitive, depression, and falls Referrals and appointments  In addition, I have reviewed and discussed with patient certain preventive protocols, quality metrics, and best practice recommendations. A written personalized care plan for preventive services as well as general preventive  health recommendations were provided to patient.     Barb Merino, LPN   3/0/8657   After Visit Summary: (MyChart) Due to this being a telephonic visit, the after visit summary with patients personalized plan was offered to patient via MyChart   Nurse Notes: none

## 2023-06-25 ENCOUNTER — Encounter: Payer: Self-pay | Admitting: Internal Medicine

## 2023-06-25 ENCOUNTER — Ambulatory Visit: Payer: Medicare PPO | Admitting: Internal Medicine

## 2023-06-25 VITALS — BP 128/72 | HR 90 | Temp 97.9°F | Ht <= 58 in | Wt 140.8 lb

## 2023-06-25 DIAGNOSIS — Z6829 Body mass index (BMI) 29.0-29.9, adult: Secondary | ICD-10-CM | POA: Diagnosis not present

## 2023-06-25 DIAGNOSIS — I129 Hypertensive chronic kidney disease with stage 1 through stage 4 chronic kidney disease, or unspecified chronic kidney disease: Secondary | ICD-10-CM | POA: Diagnosis not present

## 2023-06-25 DIAGNOSIS — D649 Anemia, unspecified: Secondary | ICD-10-CM

## 2023-06-25 DIAGNOSIS — R7989 Other specified abnormal findings of blood chemistry: Secondary | ICD-10-CM | POA: Diagnosis not present

## 2023-06-25 DIAGNOSIS — N182 Chronic kidney disease, stage 2 (mild): Secondary | ICD-10-CM

## 2023-06-25 DIAGNOSIS — L820 Inflamed seborrheic keratosis: Secondary | ICD-10-CM

## 2023-06-25 DIAGNOSIS — E663 Overweight: Secondary | ICD-10-CM

## 2023-06-25 MED ORDER — AMLODIPINE BESYLATE 2.5 MG PO TABS
2.5000 mg | ORAL_TABLET | Freq: Every day | ORAL | 2 refills | Status: DC
Start: 2023-06-25 — End: 2024-01-08

## 2023-06-25 NOTE — Progress Notes (Signed)
I,[name], CMA,acting as a Neurosurgeon for [name], MD.,have documented all relevant documentation on the behalf of [name], MD,as directed by  [name], MD while in the presence of [name], MD.  Subjective:  Patient ID: Patricia Soto , female    DOB: 04-05-1946 , 77 y.o.   MRN: [id number]  Chief Complaint  Patient presents with   Hypertension    HPI  Patient presents today for a bp check. She reports compliance with meds. She denies headaches, chest pain and shortness of breath.   She reports noticing a rash after being outdoors today. No itching. She is not sure what may have contributed to her sx.     Hypertension This is a chronic problem. The current episode started more than 1 year ago. The problem has been gradually improving since onset. The problem is controlled. Pertinent negatives include no blurred vision, chest pain, palpitations or shortness of breath. Past treatments include angiotensin blockers and diuretics. The current treatment provides moderate improvement. There are no compliance problems.      Past Medical History:  Diagnosis Date   Allergy 05/10/2023   Wasp sting   Anemia    hx teen   Angioedema 05/21/2019   Arthritis    Heart murmur    hx   Hypertension      Family History  Problem Relation Age of Onset   Diabetes Father    Congenital heart disease Father    Hypertension Father    Heart attack Father    Heart disease Father    Hypertension Sister    Hypertension Sister    Cancer Sister    Hypertension Mother    Other Mother        amyloidosis     Current Outpatient Medications:    Calcium Carbonate (CALCIUM 500 PO), Take 1 tablet by mouth daily. Takes 5 days a week, Disp: , Rfl:    Cholecalciferol (VITAMIN D3) 250 MCG (10000 UT) capsule, daily. Takes 5 days a week, Disp: , Rfl:    Ginger, Zingiber officinalis, (GINGER ROOT) 550 MG CAPS, Take 1 capsule by mouth daily., Disp: , Rfl:    Magnesium 500  MG CAPS, daily., Disp: , Rfl:    Omega-3 1000 MG CAPS, daily, Disp: , Rfl:    telmisartan (MICARDIS) 40 MG tablet, Take 40 mg by mouth daily., Disp: , Rfl:    Turmeric Curcumin 500 MG CAPS, daily, Disp: , Rfl:    Wheat Dextrin (BENEFIBER ON THE GO) PACK, Take 2 packets by mouth 2 (two) times daily. , Disp: , Rfl:    amLODipine (NORVASC) 2.5 MG tablet, Take 1 tablet (2.5 mg total) by mouth daily., Disp: 90 tablet, Rfl: 2   No Known Allergies   Review of Systems  Constitutional: Negative.   Eyes:  Negative for blurred vision.  Respiratory: Negative.  Negative for shortness of breath.   Cardiovascular: Negative.  Negative for chest pain and palpitations.  Skin:  Positive for rash.  Neurological: Negative.   Psychiatric/Behavioral: Negative.       Today's Vitals   06/25/23 1418  BP: 128/72  Pulse: 90  Temp: 97.9 F (36.6 C)  SpO2: 98%  Weight: 140 lb 12.8 oz (63.9 kg)  Height: 4\' 10"  (1.473 m)   Body mass index is 29.43 kg/m.  Wt Readings from Last 3 Encounters:  06/25/23 140 lb 12.8 oz (63.9 kg)  06/18/23 140 lb 6.4 oz (63.7 kg)  02/25/23 137 lb (  62.1 kg)     Objective:  Physical Exam Vitals and nursing note reviewed.  Constitutional:      Appearance: Normal appearance.  HENT:     Head: Normocephalic and atraumatic.  Eyes:     Extraocular Movements: Extraocular movements intact.  Cardiovascular:     Rate and Rhythm: Normal rate and regular rhythm.     Heart sounds: Normal heart sounds.  Pulmonary:     Effort: Pulmonary effort is normal.     Breath sounds: Normal breath sounds.  Musculoskeletal:     Cervical back: Normal range of motion.  Skin:    General: Skin is warm.  Neurological:     General: No focal deficit present.     Mental Status: She is alert.  Psychiatric:        Mood and Affect: Mood normal.        Behavior: Behavior normal.         Assessment And Plan:  Hypertensive nephropathy Assessment & Plan: Chronic, controlled. She will continue  with telmisartan 40mg  daily and amlodipine 2.5mg  daily. She is encouraged to follow low sodium diet. She will f/u in four to six months for re-evaluation.   Orders: -     CBC -     CMP14+EGFR -     amLODIPine Besylate; Take 1 tablet (2.5 mg total) by mouth daily.  Dispense: 90 tablet; Refill: 2  Chronic renal disease, stage II Assessment & Plan: She is encouraged to stay well hydrated and keep BP well controlled to decrease risk of CKD progression.   Orders: -     CMP14+EGFR  Seborrheic keratoses, inflamed Assessment & Plan: I will refer her to Derm for further evaluation and possible excision.   Orders: -     Ambulatory referral to Dermatology  Anemia, unspecified type Assessment & Plan: I will check CBC and iron level today. She denies having any gingival or rectal bleeding.   Orders: -     CBC -     Iron, TIBC and Ferritin Panel -     Vitamin B12  High serum vitamin D Assessment & Plan: I will check repeat vitamin D level today. I will supplement as needed.   Orders: -     VITAMIN D 25 Hydroxy (Vit-D Deficiency, Fractures)  Overweight with body mass index (BMI) of 29 to 29.9 in adult Assessment & Plan: Her BMI is acceptable for her demographic. Encouraged to c/w her regular walking regimen, she exercises over 150 minutes per week.       Return if symptoms worsen or fail to improve.  Patient was given opportunity to ask questions. Patient verbalized understanding of the plan and was able to repeat key elements of the plan. All questions were answered to their satisfaction.   I, [name], MD, have reviewed all documentation for this visit. The documentation on 06/25/23 for the exam, diagnosis, procedures, and orders are all accurate and complete.   IF YOU HAVE BEEN REFERRED TO A SPECIALIST, IT MAY TAKE 1-2 WEEKS TO SCHEDULE/PROCESS THE REFERRAL. IF YOU HAVE NOT HEARD FROM US/SPECIALIST IN TWO WEEKS, PLEASE GIVE [place] A CALL AT [phone].   THE PATIENT  IS ENCOURAGED TO PRACTICE SOCIAL DISTANCING DUE TO THE COVID-19 PANDEMIC.

## 2023-06-25 NOTE — Patient Instructions (Signed)
Hypertension, Adult Hypertension is another name for high blood pressure. High blood pressure forces your heart to work harder to pump blood. This can cause problems over time. There are two numbers in a blood pressure reading. There is a top number (systolic) over a bottom number (diastolic). It is best to have a blood pressure that is below 120/80. What are the causes? The cause of this condition is not known. Some other conditions can lead to high blood pressure. What increases the risk? Some lifestyle factors can make you more likely to develop high blood pressure: Smoking. Not getting enough exercise or physical activity. Being overweight. Having too much fat, sugar, calories, or salt (sodium) in your diet. Drinking too much alcohol. Other risk factors include: Having any of these conditions: Heart disease. Diabetes. High cholesterol. Kidney disease. Obstructive sleep apnea. Having a family history of high blood pressure and high cholesterol. Age. The risk increases with age. Stress. What are the signs or symptoms? High blood pressure may not cause symptoms. Very high blood pressure (hypertensive crisis) may cause: Headache. Fast or uneven heartbeats (palpitations). Shortness of breath. Nosebleed. Vomiting or feeling like you may vomit (nauseous). Changes in how you see. Very bad chest pain. Feeling dizzy. Seizures. How is this treated? This condition is treated by making healthy lifestyle changes, such as: Eating healthy foods. Exercising more. Drinking less alcohol. Your doctor may prescribe medicine if lifestyle changes do not help enough and if: Your top number is above 130. Your bottom number is above 80. Your personal target blood pressure may vary. Follow these instructions at home: Eating and drinking  If told, follow the DASH eating plan. To follow this plan: Fill one half of your plate at each meal with fruits and vegetables. Fill one fourth of your plate  at each meal with whole grains. Whole grains include whole-wheat pasta, brown rice, and whole-grain bread. Eat or drink low-fat dairy products, such as skim milk or low-fat yogurt. Fill one fourth of your plate at each meal with low-fat (lean) proteins. Low-fat proteins include fish, chicken without skin, eggs, beans, and tofu. Avoid fatty meat, cured and processed meat, or chicken with skin. Avoid pre-made or processed food. Limit the amount of salt in your diet to less than 1,500 mg each day. Do not drink alcohol if: Your doctor tells you not to drink. You are pregnant, may be pregnant, or are planning to become pregnant. If you drink alcohol: Limit how much you have to: 0-1 drink a day for women. 0-2 drinks a day for men. Know how much alcohol is in your drink. In the [place] one drink equals one 12 oz bottle of beer (355 mL), one 5 oz glass of wine (148 mL), or one 1 oz glass of hard liquor (44 mL). Lifestyle  Work with your doctor to stay at a healthy weight or to lose weight. Ask your doctor what the best weight is for you. Get at least 30 minutes of exercise that causes your heart to beat faster (aerobic exercise) most days of the week. This may include walking, swimming, or biking. Get at least 30 minutes of exercise that strengthens your muscles (resistance exercise) at least 3 days a week. This may include lifting weights or doing Pilates. Do not smoke or use any products that contain nicotine or tobacco. If you need help quitting, ask your doctor. Check your blood pressure at home as told by your doctor. Keep all follow-up visits. Medicines Take over-the-counter and prescription medicines   only as told by your doctor. Follow directions carefully. Do not skip doses of blood pressure medicine. The medicine does not work as well if you skip doses. Skipping doses also puts you at risk for problems. Ask your doctor about side effects or reactions to medicines that you should watch  for. Contact a doctor if: You think you are having a reaction to the medicine you are taking. You have headaches that keep coming back. You feel dizzy. You have swelling in your ankles. You have trouble with your vision. Get help right away if: You get a very bad headache. You start to feel mixed up (confused). You feel weak or numb. You feel faint. You have very bad pain in your: Chest. Belly (abdomen). You vomit more than once. You have trouble breathing. These symptoms may be an emergency. Get help right away. Call 911. Do not wait to see if the symptoms will go away. Do not drive yourself to the hospital. Summary Hypertension is another name for high blood pressure. High blood pressure forces your heart to work harder to pump blood. For most people, a normal blood pressure is less than 120/80. Making healthy choices can help lower blood pressure. If your blood pressure does not get lower with healthy choices, you may need to take medicine. This information is not intended to replace advice given to you by your health care provider. Make sure you discuss any questions you have with your health care provider. Document Revised: 08/16/2021 Document Reviewed: 08/16/2021 Elsevier Patient Education  2024 [employer]vier Inc.  

## 2023-06-27 LAB — CMP14+EGFR
ALT: 16 IU/L (ref 0–32)
AST: 23 IU/L (ref 0–40)
Albumin: 4.4 g/dL (ref 3.8–4.8)
Alkaline Phosphatase: 62 IU/L (ref 44–121)
BUN/Creatinine Ratio: 14 (ref 12–28)
BUN: 13 mg/dL (ref 8–27)
Bilirubin Total: 0.5 mg/dL (ref 0.0–1.2)
CO2: 23 mmol/L (ref 20–29)
Calcium: 9.7 mg/dL (ref 8.7–10.3)
Chloride: 105 mmol/L (ref 96–106)
Creatinine, Ser: 0.9 mg/dL (ref 0.57–1.00)
Globulin, Total: 2.3 g/dL (ref 1.5–4.5)
Glucose: 81 mg/dL (ref 70–99)
Potassium: 3.5 mmol/L (ref 3.5–5.2)
Sodium: 142 mmol/L (ref 134–144)
Total Protein: 6.7 g/dL (ref 6.0–8.5)
eGFR: 66 mL/min/{1.73_m2} (ref >59)

## 2023-06-27 LAB — CBC
Hematocrit: 34.1 % (ref 34.0–46.6)
Hemoglobin: 11.4 g/dL (ref 11.1–15.9)
MCH: 32.7 pg (ref 26.6–33.0)
MCHC: 33.4 g/dL (ref 31.5–35.7)
MCV: 98 fL — ABNORMAL HIGH (ref 79–97)
Platelets: 172 10*3/uL (ref 150–450)
RBC: 3.49 x10E6/uL — ABNORMAL LOW (ref 3.77–5.28)
RDW: 11.9 % (ref 11.7–15.4)
WBC: 4.8 10*3/uL (ref 3.4–10.8)

## 2023-06-27 LAB — VITAMIN B12: Vitamin B-12: 325 pg/mL (ref 232–1245)

## 2023-06-27 LAB — IRON,TIBC AND FERRITIN PANEL
Ferritin: 130 ng/mL (ref 15–150)
Iron Saturation: 39 % (ref 15–55)
Iron: 112 ug/dL (ref 27–139)
Total Iron Binding Capacity: 285 ug/dL (ref 250–450)
UIBC: 173 ug/dL (ref 118–369)

## 2023-06-27 LAB — VITAMIN D 25 HYDROXY (VIT D DEFICIENCY, FRACTURES): Vit D, 25-Hydroxy: 79.5 ng/mL (ref 30.0–100.0)

## 2023-07-02 ENCOUNTER — Ambulatory Visit
Admission: RE | Admit: 2023-07-02 | Discharge: 2023-07-02 | Disposition: A | Discharge status: Home / Self Care | Payer: Medicare PPO | Source: Ambulatory Visit | Attending: Internal Medicine | Admitting: Internal Medicine

## 2023-07-02 DIAGNOSIS — Z1231 Encounter for screening mammogram for malignant neoplasm of breast: Secondary | ICD-10-CM

## 2023-07-06 NOTE — Assessment & Plan Note (Signed)
I will refer her to Derm for further evaluation and possible excision.

## 2023-07-06 NOTE — Assessment & Plan Note (Signed)
Chronic, controlled. She will continue with telmisartan 40mg  daily and amlodipine 2.5mg  daily. She is encouraged to follow low sodium diet. She will f/u in four to six months for re-evaluation.

## 2023-07-06 NOTE — Assessment & Plan Note (Signed)
Her BMI is acceptable for her demographic. Encouraged to c/w her regular walking regimen, she exercises over 150 minutes per week.

## 2023-07-06 NOTE — Assessment & Plan Note (Signed)
I will check CBC and iron level today. She denies having any gingival or rectal bleeding.

## 2023-07-06 NOTE — Assessment & Plan Note (Signed)
I will check repeat vitamin D level today. I will supplement as needed.

## 2023-07-06 NOTE — Assessment & Plan Note (Signed)
She is encouraged to stay well hydrated and keep BP well controlled to decrease risk of CKD progression.

## 2023-07-07 DIAGNOSIS — H18513 Endothelial corneal dystrophy, bilateral: Secondary | ICD-10-CM | POA: Diagnosis not present

## 2023-07-07 DIAGNOSIS — H25813 Combined forms of age-related cataract, bilateral: Secondary | ICD-10-CM | POA: Diagnosis not present

## 2023-07-07 DIAGNOSIS — H524 Presbyopia: Secondary | ICD-10-CM | POA: Diagnosis not present

## 2023-07-07 DIAGNOSIS — H35363 Drusen (degenerative) of macula, bilateral: Secondary | ICD-10-CM | POA: Diagnosis not present

## 2023-07-07 DIAGNOSIS — H40013 Open angle with borderline findings, low risk, bilateral: Secondary | ICD-10-CM | POA: Diagnosis not present

## 2023-08-04 ENCOUNTER — Encounter: Payer: Self-pay | Admitting: Internal Medicine

## 2023-08-13 ENCOUNTER — Encounter: Payer: Self-pay | Admitting: Internal Medicine

## 2023-09-03 ENCOUNTER — Ambulatory Visit: Payer: Medicare PPO | Admitting: Obstetrics and Gynecology

## 2023-09-03 ENCOUNTER — Other Ambulatory Visit: Payer: Self-pay | Admitting: Internal Medicine

## 2024-01-08 ENCOUNTER — Ambulatory Visit (INDEPENDENT_AMBULATORY_CARE_PROVIDER_SITE_OTHER): Payer: Medicare PPO | Admitting: Internal Medicine

## 2024-01-08 ENCOUNTER — Encounter: Payer: Self-pay | Admitting: Internal Medicine

## 2024-01-08 VITALS — BP 122/70 | HR 87 | Temp 98.1°F | Ht <= 58 in | Wt 141.2 lb

## 2024-01-08 DIAGNOSIS — E559 Vitamin D deficiency, unspecified: Secondary | ICD-10-CM

## 2024-01-08 DIAGNOSIS — D649 Anemia, unspecified: Secondary | ICD-10-CM | POA: Diagnosis not present

## 2024-01-08 DIAGNOSIS — Z0000 Encounter for general adult medical examination without abnormal findings: Secondary | ICD-10-CM | POA: Diagnosis not present

## 2024-01-08 DIAGNOSIS — I129 Hypertensive chronic kidney disease with stage 1 through stage 4 chronic kidney disease, or unspecified chronic kidney disease: Secondary | ICD-10-CM | POA: Diagnosis not present

## 2024-01-08 DIAGNOSIS — I517 Cardiomegaly: Secondary | ICD-10-CM

## 2024-01-08 DIAGNOSIS — N182 Chronic kidney disease, stage 2 (mild): Secondary | ICD-10-CM

## 2024-01-08 DIAGNOSIS — Z833 Family history of diabetes mellitus: Secondary | ICD-10-CM | POA: Diagnosis not present

## 2024-01-08 HISTORY — DX: Encounter for general adult medical examination without abnormal findings: Z00.00

## 2024-01-08 LAB — POCT URINALYSIS DIPSTICK
Bilirubin, UA: NEGATIVE
Glucose, UA: NEGATIVE
Ketones, UA: NEGATIVE
Leukocytes, UA: NEGATIVE
Nitrite, UA: NEGATIVE
Protein, UA: NEGATIVE
Spec Grav, UA: 1.02 (ref 1.010–1.025)
Urobilinogen, UA: 0.2 U/dL
pH, UA: 7 (ref 5.0–8.0)

## 2024-01-08 MED ORDER — AMLODIPINE BESYLATE 2.5 MG PO TABS: 2.5000 mg | ORAL_TABLET | Freq: Every day | ORAL | 2 refills | Status: DC

## 2024-01-08 MED ORDER — TELMISARTAN 40 MG PO TABS: 40.0000 mg | ORAL_TABLET | Freq: Every day | ORAL | 1 refills | Status: DC

## 2024-01-08 NOTE — Assessment & Plan Note (Signed)
 Chronic, controlled. She will continue with telmisartan 40mg  daily and amlodipine 2.5mg  daily. She is encouraged to follow low sodium diet. She will f/u in four to six months for re-evaluation.

## 2024-01-08 NOTE — Patient Instructions (Signed)

## 2024-01-08 NOTE — Assessment & Plan Note (Signed)

## 2024-01-08 NOTE — Progress Notes (Signed)
 I,Patricia Soto, CMA,acting as a Neurosurgeon for Patricia Aliment, MD.,have documented all relevant documentation on the behalf of Patricia Aliment, MD,as directed by  Patricia Aliment, MD while in the presence of Patricia Aliment, MD.  Subjective:    Patient ID: Patricia Soto , female    DOB: [birth year] , 78 y.o.   MRN: [id number]  Chief Complaint  Patient presents with   Annual Exam   Hypertension    HPI  Patient presents today for annual exam. She is followed by Dr. Seymour Bars for her GYN exams.  She reports compliance with meds. She denies headaches, chest pain and shortness of breath.        Hypertension This is a chronic problem. The current episode started more than 1 year ago. The problem has been gradually improving since onset. The problem is controlled. Pertinent negatives include no blurred vision, chest pain, palpitations or shortness of breath. Past treatments include angiotensin blockers and diuretics. The current treatment provides moderate improvement. There are no compliance problems.      Past Medical History:  Diagnosis Date   Allergy 05/10/2023   Wasp sting   Anemia    hx teen   Angioedema 05/21/2019   Arthritis    Heart murmur    hx   Hypertension    Routine general medical examination at health care facility 01/08/2024     Family History  Problem Relation Age of Onset   Diabetes Father    Congenital heart disease Father    Hypertension Father    Heart attack Father    Heart disease Father    Hypertension Sister    Hypertension Sister    Cancer Sister    Hypertension Mother    Other Mother        amyloidosis     Current Outpatient Medications:    Calcium Carbonate (CALCIUM 500 PO), Take 1 tablet by mouth daily. Takes 5 days a week, Disp: , Rfl:    Cholecalciferol (VITAMIN D3) 250 MCG (10000 UT) capsule, daily. Takes 5 days a week, Disp: , Rfl:    Ginger, Zingiber officinalis, (GINGER ROOT) 550 MG CAPS, Take 1 capsule by mouth daily., Disp: ,  Rfl:    Magnesium 500 MG CAPS, daily., Disp: , Rfl:    Omega-3 1000 MG CAPS, daily, Disp: , Rfl:    Turmeric Curcumin 500 MG CAPS, daily, Disp: , Rfl:    Wheat Dextrin (BENEFIBER ON THE GO) PACK, Take 2 packets by mouth 2 (two) times daily. , Disp: , Rfl:    amLODipine (NORVASC) 2.5 MG tablet, Take 1 tablet (2.5 mg total) by mouth daily., Disp: 90 tablet, Rfl: 2   telmisartan (MICARDIS) 40 MG tablet, Take 1 tablet (40 mg total) by mouth daily., Disp: 90 tablet, Rfl: 1   No Known Allergies    The patient states she uses post menopausal status for birth control. No LMP recorded. Patient is postmenopausal.. Negative for Dysmenorrhea. Negative for: breast discharge, breast lump(s), breast pain and breast self exam. Associated symptoms include abnormal vaginal bleeding. Pertinent negatives include abnormal bleeding (hematology), anxiety, decreased libido, depression, difficulty falling sleep, dyspareunia, history of infertility, nocturia, sexual dysfunction, sleep disturbances, urinary incontinence, urinary urgency, vaginal discharge and vaginal itching. Diet regular.The patient states her exercise level is  moderate, gradually returning to regular exercise regimen.   . The patient's tobacco use is:  Social History   Tobacco Use  Smoking Status Never  Smokeless Tobacco Never  . She has  been exposed to passive smoke. The patient's alcohol use is:  Social History   Substance and Sexual Activity  Alcohol Use Yes   Alcohol/week: 2.0 - 3.0 standard drinks of alcohol   Types: 2 - 3 Glasses of wine per week   Comment: WINE -     Review of Systems  Constitutional: Negative.   HENT: Negative.    Eyes: Negative.  Negative for blurred vision.  Respiratory: Negative.  Negative for shortness of breath.   Cardiovascular: Negative.  Negative for chest pain and palpitations.  Gastrointestinal: Negative.   Endocrine: Negative.   Genitourinary: Negative.   Musculoskeletal: Negative.   Skin: Negative.    Allergic/Immunologic: Negative.   Neurological: Negative.   Hematological: Negative.   Psychiatric/Behavioral: Negative.       Today's Vitals   01/08/24 1404  BP: 122/70  Pulse: 87  Temp: 98.1 F (36.7 C)  SpO2: 98%  Weight: 141 lb 3.2 oz (64 kg)  Height: 4\' 10"  (1.473 m)   Body mass index is 29.51 kg/m.  Wt Readings from Last 3 Encounters:  01/08/24 141 lb 3.2 oz (64 kg)  06/25/23 140 lb 12.8 oz (63.9 kg)  06/18/23 140 lb 6.4 oz (63.7 kg)     Objective:  Physical Exam Vitals and nursing note reviewed.  Constitutional:      Appearance: Normal appearance.  HENT:     Head: Normocephalic and atraumatic.     Right Ear: Tympanic membrane, ear canal and external ear normal.     Left Ear: Tympanic membrane, ear canal and external ear normal.  Eyes:     Extraocular Movements: Extraocular movements intact.     Conjunctiva/sclera: Conjunctivae normal.     Pupils: Pupils are equal, round, and reactive to light.  Cardiovascular:     Rate and Rhythm: Normal rate and regular rhythm.     Pulses: Normal pulses.     Heart sounds: Normal heart sounds.  Pulmonary:     Effort: Pulmonary effort is normal.     Breath sounds: Normal breath sounds.  Chest:  Breasts:    Tanner Score is 5.     Right: Normal.     Left: Normal.  Abdominal:     General: Abdomen is flat. Bowel sounds are normal.     Palpations: Abdomen is soft.  Genitourinary:    Comments: deferred Musculoskeletal:        General: Normal range of motion.     Cervical back: Normal range of motion and neck supple.  Skin:    General: Skin is warm and dry.  Neurological:     General: No focal deficit present.     Mental Status: She is alert and oriented to person, place, and time.  Psychiatric:        Mood and Affect: Mood normal.        Behavior: Behavior normal.         Assessment And Plan:     Routine general medical examination at health care facility Assessment & Plan: A full exam was performed.   Importance of monthly self breast exams was discussed with the patient.  She is advised to get 3045 minutes of regular exercise, no less than four to five days per week. Both weight-bearing and aerobic exercises are recommended.  She is advised to follow a healthy diet with at least six fruits/veggies per day, decrease intake of red meat and other saturated fats and to increase fish intake to twice weekly.  Meats/fish should not be  fried -- baked, boiled or broiled is preferable. It is also important to cut back on your sugar intake.  Be sure to read labels - try to avoid anything with added sugar, high fructose corn syrup or other sweeteners.  If you must use a sweetener, you can try stevia or monkfruit.  It is also important to avoid artificially sweetened foods/beverages and diet drinks. Lastly, wear SPF 50 sunscreen on exposed skin and when in direct sunlight for an extended period of time.  Be sure to avoid fast food restaurants and aim for at least 60 ounces of water daily.       Hypertensive nephropathy Assessment & Plan: Chronic, controlled. She will continue with telmisartan 40mg  daily and amlodipine 2.5mg  daily. She is encouraged to follow low sodium diet. She will f/u in four to six months for re-evaluation.   Orders: -     CBC -     CMP14+EGFR -     Lipid panel -     POCT urinalysis dipstick -     Microalbumin / creatinine urine ratio -     EKG 12-Lead -     amLODIPine Besylate; Take 1 tablet (2.5 mg total) by mouth daily.  Dispense: 90 tablet; Refill: 2 -     ECHOCARDIOGRAM COMPLETE; Future  Chronic renal disease, stage II Assessment & Plan: She is encouraged to stay well hydrated and keep BP well controlled to decrease risk of CKD progression.   Orders: -     CMP14+EGFR  LVH (left ventricular hypertrophy) Assessment & Plan: Seen on EKG, she agrees to echocardiogram for further evaluation.   Orders: -     ECHOCARDIOGRAM COMPLETE; Future  Family history of diabetes  mellitus -     Hemoglobin A1c  Other orders -     Telmisartan; Take 1 tablet (40 mg total) by mouth daily.  Dispense: 90 tablet; Refill: 1  c   Return for 1 year physical, 6 month bp. Patient was given opportunity to ask questions. Patient verbalized understanding of the plan and was able to repeat key elements of the plan. All questions were answered to their satisfaction.    I, Patricia Aliment, MD, have reviewed all documentation for this visit. The documentation on 01/08/24 for the exam, diagnosis, procedures, and orders are all accurate and complete.

## 2024-01-09 LAB — LIPID PANEL
Chol/HDL Ratio: 2.8 {ratio} (ref 0.0–4.4)
Cholesterol, Total: 217 mg/dL — ABNORMAL HIGH (ref 100–199)
HDL: 77 mg/dL (ref >39)
LDL Chol Calc (NIH): 129 mg/dL — ABNORMAL HIGH (ref 0–99)
Triglycerides: 61 mg/dL (ref 0–149)
VLDL Cholesterol Cal: 11 mg/dL (ref 5–40)

## 2024-01-09 LAB — MICROALBUMIN / CREATININE URINE RATIO
Creatinine, Urine: 33 mg/dL
Microalb/Creat Ratio: 10 mg/g{creat} (ref 0–29)
Microalbumin, Urine: 3.3 ug/mL

## 2024-01-09 LAB — CMP14+EGFR
ALT: 14 [IU]/L (ref 0–32)
AST: 22 [IU]/L (ref 0–40)
Albumin: 4.5 g/dL (ref 3.8–4.8)
Alkaline Phosphatase: 71 [IU]/L (ref 44–121)
BUN/Creatinine Ratio: 16 (ref 12–28)
BUN: 13 mg/dL (ref 8–27)
Bilirubin Total: 0.7 mg/dL (ref 0.0–1.2)
CO2: 21 mmol/L (ref 20–29)
Calcium: 9.7 mg/dL (ref 8.7–10.3)
Chloride: 105 mmol/L (ref 96–106)
Creatinine, Ser: 0.83 mg/dL (ref 0.57–1.00)
Globulin, Total: 2.3 g/dL (ref 1.5–4.5)
Glucose: 81 mg/dL (ref 70–99)
Potassium: 3.7 mmol/L (ref 3.5–5.2)
Sodium: 142 mmol/L (ref 134–144)
Total Protein: 6.8 g/dL (ref 6.0–8.5)
eGFR: 73 mL/min/{1.73_m2} (ref >59)

## 2024-01-09 LAB — CBC
Hematocrit: 35.6 % (ref 34.0–46.6)
Hemoglobin: 11.8 g/dL (ref 11.1–15.9)
MCH: 31.9 pg (ref 26.6–33.0)
MCHC: 33.1 g/dL (ref 31.5–35.7)
MCV: 96 fL (ref 79–97)
Platelets: 178 10*3/uL (ref 150–450)
RBC: 3.7 x10E6/uL — ABNORMAL LOW (ref 3.77–5.28)
RDW: 12.3 % (ref 11.7–15.4)
WBC: 4.2 10*3/uL (ref 3.4–10.8)

## 2024-01-09 LAB — HEMOGLOBIN A1C
Est. average glucose Bld gHb Est-mCnc: 103 mg/dL
Hgb A1c MFr Bld: 5.2 % (ref 4.8–5.6)

## 2024-01-13 ENCOUNTER — Encounter: Payer: Self-pay | Admitting: Internal Medicine

## 2024-01-13 DIAGNOSIS — I517 Cardiomegaly: Secondary | ICD-10-CM | POA: Insufficient documentation

## 2024-01-13 NOTE — Assessment & Plan Note (Signed)
She is encouraged to stay well hydrated and keep BP well controlled to decrease risk of CKD progression.

## 2024-01-13 NOTE — Assessment & Plan Note (Signed)
 Seen on EKG, she agrees to echocardiogram for further evaluation.

## 2024-01-15 ENCOUNTER — Encounter: Payer: Self-pay | Admitting: Dermatology

## 2024-01-15 ENCOUNTER — Ambulatory Visit: Admitting: Dermatology

## 2024-01-15 VITALS — BP 125/75

## 2024-01-15 DIAGNOSIS — L820 Inflamed seborrheic keratosis: Secondary | ICD-10-CM | POA: Diagnosis not present

## 2024-01-15 DIAGNOSIS — B078 Other viral warts: Secondary | ICD-10-CM | POA: Diagnosis not present

## 2024-01-15 NOTE — Patient Instructions (Addendum)
 Hello Averi,  Thank you for visiting [place] today. We appreciate your commitment to addressing your skin concerns. Here is a summary of the key instructions from today's consultation:  Irritated Seborrheic Keratosis:  Treatment with Liquid Nitrogen: Your inflamed seborrheic keratoses and wart were treated using liquid nitrogen. This treatment will cause the areas to sting, become crusty, and eventually fall off.  Post-Treatment Care:   Application: Apply Aquaphor ointment to the treated areas twice daily. Do this after showering in the morning and again at night.   Aquaphor Sample: We provided a sample of Aquaphor today. You can purchase more at any store.  Follow-Up: Please schedule a follow-up appointment for a touch-up if any of the treated spots only partially respond within 2 months.  It was a pleasure meeting you, and I wish you a wonderful day. If you have any questions or concerns before your next visit, please do not hesitate to contact our office.  Best regards,  Dr. Langston Reusing, Dermatology  Important Information  Due to recent changes in healthcare laws, you may see results of your pathology and/or laboratory studies on MyChart before the doctors have had a chance to review them. We understand that in some cases there may be results that are confusing or concerning to you. Please understand that not all results are received at the same time and often the doctors may need to interpret multiple results in order to provide you with the best plan of care or course of treatment. Therefore, we ask that you please give [place] 2 business days to thoroughly review all your results before contacting the office for clarification. Should we see a critical lab result, you will be contacted sooner.   If You Need Anything After Your Visit  If you have any questions or concerns for your doctor, please call our main line at [phone] If no one answers, please leave a voicemail as directed and we  will return your call as soon as possible. Messages left after 4 pm will be answered the following business day.   You may also send [place] a message via MyChart. We typically respond to MyChart messages within 1-2 business days.  For prescription refills, please ask your pharmacy to contact our office. Our fax number is [phone].  If you have an urgent issue when the clinic is closed that cannot wait until the next business day, you can page your doctor at the number below.    Please note that while we do our best to be available for urgent issues outside of office hours, we are not available 24/7.   If you have an urgent issue and are unable to reach [place], you may choose to seek medical care at your doctor's office, retail clinic, urgent care center, or emergency room.  If you have a medical emergency, please immediately call 911 or go to the emergency department. In the event of inclement weather, please call our main line at [phone] for an update on the status of any delays or closures.  Dermatology Medication Tips: Please keep the boxes that topical medications come in in order to help keep track of the instructions about where and how to use these. Pharmacies typically print the medication instructions only on the boxes and not directly on the medication tubes.   If your medication is too expensive, please contact our office at [phone] or send [place] a message through MyChart.   We are unable to tell what your co-pay for medications will be  in advance as this is different depending on your insurance coverage. However, we may be able to find a substitute medication at lower cost or fill out paperwork to get insurance to cover a needed medication.   If a prior authorization is required to get your medication covered by your insurance company, please allow [place] 1-2 business days to complete this process.  Drug prices often vary depending on where the prescription is filled and some  pharmacies may offer cheaper prices.  The website www.goodrx.com contains coupons for medications through different pharmacies. The prices here do not account for what the cost may be with help from insurance (it may be cheaper with your insurance), but the website can give you the price if you did not use any insurance.  - You can print the associated coupon and take it with your prescription to the pharmacy.  - You may also stop by our office during regular business hours and pick up a GoodRx coupon card.  - If you need your prescription sent electronically to a different pharmacy, notify our office through [facility] or by phone at [phone]

## 2024-01-15 NOTE — Progress Notes (Signed)
   New Patient Visit   Subjective  Patricia Soto is a 78 y.o. female who presents for the following: New Pt - ISK  Patient states she has ISK located at the shoulder & L back that she would like to have examined. Patient reports the areas have been there for over 2 years. She reports the areas are not bothersome.Patient rates irritation 0 out of 10. She states that the areas have not spread. Patient reports she has not previously been treated for these areas. Patient denied Hx of bx. Patient denied family history of skin cancer(s).   The following portions of the chart were reviewed this encounter and updated as appropriate: medications, allergies, medical history  Review of Systems:  No other skin or systemic complaints except as noted in HPI or Assessment and Plan.  Objective  Well appearing patient in no apparent distress; mood and affect are within normal limits.   A focused examination was performed of the following areas: shoulder, L back   Relevant exam findings are noted in the Assessment and Plan.  Left Zygomatic Area Verrucous papules  R shoulder, chin & back (9) Inflamed papules  Assessment and Plan  1. Inflamed seborrheic keratoses - Assessment: Multiple inflamed seborrheic keratoses causing itching and irritation. Previously diagnosed as "wisdom spots" by primary care physician, Dr. Allyne Gee. Lesions are particularly bothersome when exposed to heat sources like a blow-dryer. - Plan:    Cryotherapy with liquid nitrogen for symptomatic lesions    Apply Aquaphor to treated areas twice daily (morning and night) after showering for 3-4 weeks    Follow-up in 2 months if any lesions only partially respond, for potential touch-up treatment    Patient education provided on expected post-treatment course (stinging, crusting, and falling off within 3 weeks)  2. Wart - Assessment: Patient has a wart causing discomfort. Treatment recommended to prevent spreading. - Plan:     Cryotherapy with liquid nitrogen    Apply Aquaphor to treated area twice daily (morning and night) after showering for 3-4 weeks    Patient education provided on expected post-treatment course (stinging, crusting, and falling off within 3 weeks)  Follow-up in 2 months if any seborrheic keratosis lesions only partially respond, for potential touch-up treatment. OTHER VIRAL WARTS Left Zygomatic Area Destruction of lesion - Left Zygomatic Area Complexity: simple   Destruction method: cryotherapy   Informed consent: discussed and consent obtained   Timeout:  patient name, date of birth, surgical site, and procedure verified Lesion destroyed using liquid nitrogen: Yes   Region frozen until ice ball extended beyond lesion: Yes   Outcome: patient tolerated procedure well with no complications   Post-procedure details: wound care instructions given   INFLAMED SEBORRHEIC KERATOSIS (9) R shoulder, chin & back (9) Destruction of lesion - R shoulder, chin & back (9) Complexity: simple   Destruction method: cryotherapy   Informed consent: discussed and consent obtained   Timeout:  patient name, date of birth, surgical site, and procedure verified Lesion destroyed using liquid nitrogen: Yes   Region frozen until ice ball extended beyond lesion: Yes   Outcome: patient tolerated procedure well with no complications   Post-procedure details: wound care instructions given    No follow-ups on file.    Documentation: I have reviewed the above documentation for accuracy and completeness, and I agree with the above.  I, Shirron Marcha Solders, CMA, am acting as scribe for [employer], DO.   Langston Reusing, DO

## 2024-02-09 ENCOUNTER — Encounter: Payer: Self-pay | Admitting: Internal Medicine

## 2024-02-11 ENCOUNTER — Ambulatory Visit (HOSPITAL_COMMUNITY): Attending: Cardiology

## 2024-02-11 DIAGNOSIS — I129 Hypertensive chronic kidney disease with stage 1 through stage 4 chronic kidney disease, or unspecified chronic kidney disease: Secondary | ICD-10-CM | POA: Diagnosis not present

## 2024-02-11 DIAGNOSIS — I517 Cardiomegaly: Secondary | ICD-10-CM | POA: Insufficient documentation

## 2024-02-11 LAB — ECHOCARDIOGRAM COMPLETE
Area-P 1/2: 4.15 cm2
S' Lateral: 2.2 cm

## 2024-02-15 ENCOUNTER — Encounter: Payer: Self-pay | Admitting: Internal Medicine

## 2024-03-11 ENCOUNTER — Encounter: Payer: Self-pay | Admitting: Internal Medicine

## 2024-03-22 ENCOUNTER — Encounter: Payer: Self-pay | Admitting: Internal Medicine

## 2024-03-30 ENCOUNTER — Ambulatory Visit: Admitting: Dermatology

## 2024-03-30 VITALS — BP 134/69

## 2024-03-30 DIAGNOSIS — L649 Androgenic alopecia, unspecified: Secondary | ICD-10-CM

## 2024-03-30 DIAGNOSIS — L820 Inflamed seborrheic keratosis: Secondary | ICD-10-CM | POA: Diagnosis not present

## 2024-03-30 MED ORDER — SAFETY SEAL MISCELLANEOUS MISC: 1.0000 | Freq: Every morning | 3 refills | Status: DC

## 2024-03-30 NOTE — Progress Notes (Signed)
 Follow-Up Visit   Subjective  Patricia Soto is a 78 y.o. female who presents for the following: ISK follow up of right shoulder, chin and back treated with LN2 01/15/24. All spot treated came right off. She does have another spot on her right shoulder that she would like treated. She would also like to discuss hair thinning. She has noticed it thinning for a few years. She has used [place] 2% Minoxidil solution for ~ 5 years once or twice daily but it doesn't seem to help.    The following portions of the chart were reviewed this encounter and updated as appropriate: medications, allergies, medical history  Review of Systems:  No other skin or systemic complaints except as noted in HPI or Assessment and Plan.  Objective  Well appearing patient in no apparent distress; mood and affect are within normal limits.   A focused examination was performed of the following areas: Face, scalp, right shoulder   Relevant exam findings are noted in the Assessment and Plan.            Right Shoulder - Anterior Erythematous stuck-on, waxy papule or plaque  Assessment & Plan   ANDROGENETIC ALOPECIA (FEMALE PATTERN HAIR LOSS) Exam: Diffuse thinning of the crown and widening of the midline part with retention of the frontal hairline   Female Androgenic Alopecia is a chronic condition related to genetics and/or hormonal changes.  In women androgenetic alopecia is commonly associated with menopause but may occur any time after puberty.  It causes hair thinning primarily on the crown with widening of the part and temporal hairline recession.  Can use OTC Rogaine (minoxidil) 5% solution/foam as directed.   Assessment: Patient presents with hair thinning ongoing for a couple of years. Pattern of hair loss, particularly in the frontal area, is consistent with classic androgenetic alopecia. No clinical signs of scarring on the scalp. Patient's age (78) and presumed postmenopausal status contribute  to the diagnosis. Patient previously used 2% minoxidil for 4-5 years with minimal improvement. - Plan:    Prescribe compounded 8% minoxidil with finasteride solution from [facility] pharmacy     - Instructed patient on proper application: a few drops applied to affected areas in the morning, massaged into scalp     - Informed patient that it may take 4 months to see early signs of improvement    Recommend daily collagen supplement to be added to morning beverage    Suggest over-the-counter Viviscal supplement, 2 tablets daily    Advised patient to continue current hair care routine, including monthly hair dyeing    Scheduled follow-up appointment in 4 months to assess progress    Plan to take baseline photographs of affected areas for progress tracking   . Seborrheic keratosis - Assessment: A brown waxy papule identified on the right anterior shoulder, consistent with seborrheic keratosis. This benign growth was deemed appropriate for cryotherapy treatment. - Plan:    Performed cryotherapy on the seborrheic keratosis on the right anterior shoulder for 20 seconds    Instructed patient to apply Aquaphor Ointment to the treated area    Advised patient that the treated lesion should fall off  INFLAMED SEBORRHEIC KERATOSIS Right Shoulder - Anterior Symptomatic, irritating, patient would like treated.  Benign-appearing.  Call clinic for new or changing lesions.   Destruction of lesion - Right Shoulder - Anterior Complexity: simple   Destruction method: cryotherapy   Informed consent: discussed and consent obtained   Timeout:  patient name, date of birth,  surgical site, and procedure verified Lesion destroyed using liquid nitrogen: Yes   Region frozen until ice ball extended beyond lesion: Yes   Outcome: patient tolerated procedure well with no complications   Post-procedure details: wound care instructions given    Return in about 4 months (around 07/31/2024) for Alopecia.  I, Eliot Guernsey,  CMA, am acting as scribe for [employer], DO .   Documentation: I have reviewed the above documentation for accuracy and completeness, and I agree with the above.  Louana Roup, DO

## 2024-03-30 NOTE — Patient Instructions (Addendum)
 Date: Tue Mar 30 2024  Hello [place],  Thank you for visiting today. Here is a summary of the key instructions:  - Medications:   - Apply prescription minoxidil compound solution from [facility] pharmacy to thinning areas of scalp each morning   - Put a few drops on thinning areas and massage in   - Do not apply at night to avoid getting on pillow and face  - Supplements:   - Take Viviscal tablets twice daily   - Add collagen powder to morning coffee or tea  - Skin Care:   - Apply Aquaphor Ointment to treated area on right anterior shoulder  - Lifestyle Changes:   - Continue current hair care routine and hair dye schedule   - Use extra conditioner to keep hair hydrated  - Follow-up:   - Return in 4 months for follow-up appointment   - Expect to see early signs of hair improvement at that time  Please reach out if you have any questions or concerns.  Warm regards,  Dr. Louana Roup [facility]      Cryotherapy Aftercare  Wash gently with soap and water everyday.   Apply Vaseline and Band-Aid daily until healed.    Your provider has sent your prescription to [facility] in [place], [place] . A pharmacy representative will call you to confirm details and take your payment information. If you do not receive a call within 24 hours, please contact the pharmacy at [phone] or [phone]. Your unique skincare compound is being formulated in our lab (most compounds take less than 24 hours). Your prescription is shipped vis USPS to your mailbox (2-4 business days). Priority shipping is available at an additional cost. Once received, you will electronically sign/acknowledge that you received your prescription. The pharmacy hours are Monday-Friday 9 am-6 pm EST and Saturday 9 am-1 pm EST.                Important Information  Due to recent changes in healthcare laws, you may see results of your pathology and/or laboratory studies on MyChart before the doctors  have had a chance to review them. We understand that in some cases there may be results that are confusing or concerning to you. Please understand that not all results are received at the same time and often the doctors may need to interpret multiple results in order to provide you with the best plan of care or course of treatment. Therefore, we ask that you please give us  2 business days to thoroughly review all your results before contacting the office for clarification. Should we see a critical lab result, you will be contacted sooner.   If You Need Anything After Your Visit  If you have any questions or concerns for your doctor, please call our main line at [phone] If no one answers, please leave a voicemail as directed and we will return your call as soon as possible. Messages left after 4 pm will be answered the following business day.   You may also send us  a message via MyChart. We typically respond to MyChart messages within 1-2 business days.  For prescription refills, please ask your pharmacy to contact our office. Our fax number is [phone].  If you have an urgent issue when the clinic is closed that cannot wait until the next business day, you can page your doctor at the number below.    Please note that while we do our best to be available for urgent issues outside of office hours,  we are not available 24/7.   If you have an urgent issue and are unable to reach us , you may choose to seek medical care at your doctor's office, retail clinic, urgent care center, or emergency room.  If you have a medical emergency, please immediately call 911 or go to the emergency department. In the event of inclement weather, please call our main line at [phone] for an update on the status of any delays or closures.  Dermatology Medication Tips: Please keep the boxes that topical medications come in in order to help keep track of the instructions about where and how to use these.  Pharmacies typically print the medication instructions only on the boxes and not directly on the medication tubes.   If your medication is too expensive, please contact our office at [phone] or send us  a message through MyChart.   We are unable to tell what your co-pay for medications will be in advance as this is different depending on your insurance coverage. However, we may be able to find a substitute medication at lower cost or fill out paperwork to get insurance to cover a needed medication.   If a prior authorization is required to get your medication covered by your insurance company, please allow us  1-2 business days to complete this process.  Drug prices often vary depending on where the prescription is filled and some pharmacies may offer cheaper prices.  The website www.goodrx.com contains coupons for medications through different pharmacies. The prices here do not account for what the cost may be with help from insurance (it may be cheaper with your insurance), but the website can give you the price if you did not use any insurance.  - You can print the associated coupon and take it with your prescription to the pharmacy.  - You may also stop by our office during regular business hours and pick up a GoodRx coupon card.  - If you need your prescription sent electronically to a different pharmacy, notify our office through [facility] or by phone at [phone]

## 2024-05-18 ENCOUNTER — Other Ambulatory Visit: Payer: Self-pay | Admitting: Internal Medicine

## 2024-05-18 DIAGNOSIS — Z1231 Encounter for screening mammogram for malignant neoplasm of breast: Secondary | ICD-10-CM

## 2024-06-18 ENCOUNTER — Encounter

## 2024-07-02 ENCOUNTER — Ambulatory Visit (INDEPENDENT_AMBULATORY_CARE_PROVIDER_SITE_OTHER)

## 2024-07-02 DIAGNOSIS — Z0000 Encounter for general adult medical examination without abnormal findings: Secondary | ICD-10-CM

## 2024-07-02 NOTE — Progress Notes (Signed)
 Subjective:   Patricia Soto is a 78 y.o. female who presents for Medicare Annual (Subsequent) preventive examination.  Visit Complete:  Virtual I connected with  Patricia Soto on 07/02/24 by a audio enabled telemedicine application and verified that I am speaking with the correct person using two identifiers.  Patient Location: Home  Provider Location: Office/Clinic  I discussed the limitations of evaluation and management by telemedicine. The patient expressed understanding and agreed to proceed.  Vital Signs: Because this visit was a virtual/telehealth visit, some criteria may be missing or patient reported. Any vitals not documented were not able to be obtained and vitals that have been documented are patient reported. Patient Medicare AWV questionnaire was completed by the patient on 07/02/24; I have confirmed that all information answered by patient is correct and no changes since this date.        Objective:    There were no vitals filed for this visit. There is no height or weight on file to calculate BMI.     06/18/2023   11:52 AM 05/30/2022    2:14 PM 04/25/2021    2:22 PM 04/13/2020    2:19 PM 06/17/2019    3:59 PM 10/01/2018    2:28 PM 10/30/2015    6:48 AM  Advanced Directives  Does Patient Have a Medical Advance Directive? Yes Yes Yes Yes Yes Yes    Type of Estate agent of [place];Living will Healthcare Power of [place];Living will Healthcare Power of [place];Living will Healthcare Power of [place];Living will Living will Living will   Does patient want to make changes to medical advance directive?      No - Patient declined    Copy of Healthcare Power of Attorney in Chart? No - copy requested No - copy requested No - copy requested No - copy requested   No - copy requested      Data saved with a previous flowsheet row definition    Current Medications (verified) Outpatient Encounter Medications as of 07/02/2024  Medication Sig    amLODipine  (NORVASC ) 2.5 MG tablet Take 1 tablet (2.5 mg total) by mouth daily.   Calcium Carbonate (CALCIUM 500 PO) Take 1 tablet by mouth daily. Takes 5 days a week   Cholecalciferol (VITAMIN D3) 250 MCG (10000 UT) capsule daily. Takes 5 days a week   Ginger, Zingiber officinalis, (GINGER ROOT) 550 MG CAPS Take 1 capsule by mouth daily.   Magnesium 500 MG CAPS daily.   Omega-3 1000 MG CAPS daily   Safety Seal Miscellaneous MISC Apply 1 application  topically in the morning. Medication name: Hormonic Hair   telmisartan  (MICARDIS ) 40 MG tablet Take 1 tablet (40 mg total) by mouth daily.   Turmeric Curcumin 500 MG CAPS daily   Wheat Dextrin (BENEFIBER ON THE GO) PACK Take 2 packets by mouth 2 (two) times daily.    No facility-administered encounter medications on file as of 07/02/2024.    Allergies (verified) Patient has no known allergies.   History: Past Medical History:  Diagnosis Date   Allergy 05/10/2023   Wasp sting   Anemia    hx teen   Angioedema 05/21/2019   Arthritis    Heart murmur    hx   Hypertension    Routine general medical examination at health care facility 01/08/2024   Past Surgical History:  Procedure Laterality Date   COLPOSCOPY  1991   FOOT SURGERY Bilateral    [id number],2013 rt,94 ,2012,lft toes2008lft   KNEE ARTHROSCOPY  09/19/2022   Dr. Beverley   MYOMECTOMY  1990   PARATHYROIDECTOMY N/A 10/30/2015   Procedure: PARATHYROIDECTOMY;  Surgeon: Krystal Spinner, MD;  Location: [facility] OR;  Service: General;  Laterality: N/A;   Family History  Problem Relation Age of Onset   Diabetes Father    Congenital heart disease Father    Hypertension Father    Heart attack Father    Heart disease Father    Hypertension Sister    Hypertension Sister    Cancer Sister    Hypertension Mother    Other Mother        amyloidosis   Social History   Socioeconomic History   Marital status: Single    Spouse name: Not on file   Number of children: Not on file   Years of  education: Not on file   Highest education level: Doctorate  Occupational History   Occupation: retired  Tobacco Use   Smoking status: Never   Smokeless tobacco: Never  Vaping Use   Vaping status: Never Used  Substance and Sexual Activity   Alcohol use: Yes    Alcohol/week: 2.0 - 3.0 standard drinks of alcohol    Types: 2 - 3 Glasses of wine per week    Comment: WINE -    Drug use: No   Sexual activity: Not Currently    Partners: Male    Comment: 1ST  intercourse- 26, partners - refused to answer   Other Topics Concern   Not on file  Social History Narrative   Not on file   Social Drivers of Health   Financial Resource Strain: Low Risk  (07/01/2024)   Overall Financial Resource Strain (CARDIA)    Difficulty of Paying Living Expenses: Not hard at all  Food Insecurity: No Food Insecurity (07/01/2024)   Hunger Vital Sign    Worried About Running Out of Food in the Last Year: Never true    Ran Out of Food in the Last Year: Never true  Transportation Needs: No Transportation Needs (07/01/2024)   PRAPARE - Administrator, Civil Service (Medical): No    Lack of Transportation (Non-Medical): No  Physical Activity: Sufficiently Active (07/01/2024)   Exercise Vital Sign    Days of Exercise per Week: 5 days    Minutes of Exercise per Session: 60 min  Stress: Stress Concern Present (07/01/2024)   [employer] of Occupational Health - Occupational Stress Questionnaire    Feeling of Stress: To some extent  Social Connections: Moderately Integrated (07/01/2024)   Social Connection and Isolation Panel    Frequency of Communication with Friends and Family: More than three times a week    Frequency of Social Gatherings with Friends and Family: Once a week    Attends Religious Services: More than 4 times per year    Active Member of [employer] or Organizations: Yes    Attends Engineer, structural: More than 4 times per year    Marital Status: Never married    Tobacco  Counseling Counseling given: Not Answered   Clinical Intake:                        Activities of Daily Living    07/01/2024   10:38 AM  In your present state of health, do you have any difficulty performing the following activities:  Hearing? 0  Vision? 0  Difficulty concentrating or making decisions? 0  Walking or climbing stairs? 0  Dressing or bathing? 0  Doing errands, shopping? 0  Preparing Food and eating ? N  Using the Toilet? N  In the past six months, have you accidently leaked urine? Y  Do you have problems with loss of bowel control? N  Managing your Medications? N  Managing your Finances? N  Housekeeping or managing your Housekeeping? N    Patient Care Team: Jarold Medici, MD as PCP - General (Internal Medicine) Pa, [facility] [place], Almarie POUR, MD as Consulting Physician (Obstetrics and Gynecology)  Indicate any recent Medical Services you may have received from other than [facility] providers in the past year (date may be approximate).     Assessment:   This is a routine wellness examination for Eyvonne.  Hearing/Vision screen No results found.   Goals Addressed   None    Depression Screen    01/08/2024    2:04 PM 06/18/2023   11:57 AM 01/02/2023    2:15 PM 09/18/2022    4:32 PM 08/01/2022    2:55 PM 05/30/2022    2:15 PM 04/25/2021    2:24 PM  PHQ 2/9 Scores  PHQ - 2 Score 0 0 0 0 0 0 0  PHQ- 9 Score 0 0         Fall Risk    07/01/2024   10:38 AM 01/08/2024    2:03 PM 06/14/2023   11:47 AM 01/02/2023    2:15 PM 09/18/2022    4:32 PM  Fall Risk   Falls in the past year? 0 0 0 0 0  Number falls in past yr: 0 0 0 0 0  Injury with Fall?  0 0 0 0  Risk for fall due to :  No Fall Risks Medication side effect No Fall Risks No Fall Risks  Follow up  Falls evaluation completed Falls prevention discussed;Falls evaluation completed Falls evaluation completed Falls evaluation completed      Data saved with a previous flowsheet row  definition    MEDICARE RISK AT HOME: Medicare Risk at Home Any stairs in or around the home?: (Patient-Rptd) Yes If so, are there any without handrails?: (Patient-Rptd) No Home free of loose throw rugs in walkways, pet beds, electrical cords, etc?: (Patient-Rptd) No Adequate lighting in your home to reduce risk of falls?: (Patient-Rptd) Yes Life alert?: (Patient-Rptd) No Use of a cane, walker or w/c?: (Patient-Rptd) No Grab bars in the bathroom?: (Patient-Rptd) Yes Shower chair or bench in shower?: (Patient-Rptd) No Elevated toilet seat or a handicapped toilet?: (Patient-Rptd) Yes  TIMED UP AND GO:  Was the test performed?  No    Cognitive Function:        06/18/2023   11:59 AM 05/30/2022    2:18 PM 04/25/2021    2:25 PM 04/13/2020    2:22 PM 06/17/2019    4:06 PM  6CIT Screen  What Year? 0 points 0 points 0 points 0 points 0 points  What month? 0 points 0 points 0 points 0 points 0 points  What time? 0 points 0 points 0 points 0 points 0 points  Count back from 20 0 points 0 points 0 points 0 points 0 points  Months in reverse 0 points 0 points 0 points 2 points 0 points  Repeat phrase 0 points 0 points 0 points 0 points 0 points  Total Score 0 points 0 points 0 points 2 points 0 points    Immunizations Immunization History  Administered Date(s) Administered   Fluad Quad(high Dose 65+) 08/01/2022, 07/25/2023   Influenza, High Dose  Seasonal PF 08/29/2018, 08/23/2019, 07/26/2020, 07/27/2020, 08/09/2021   Influenza,inj,Quad PF,6+ Mos 07/27/2020   Influenza-Unspecified 08/23/2019   Moderna Sars-Covid-2 Vaccination 01/03/2020, 02/01/2020, 09/14/2020, 05/01/2021, 09/03/2021   Pfizer(Comirnaty)Fall Seasonal Vaccine 12 years and older 02/25/2024   Pneumococcal Conjugate-13 06/08/2015   Pneumococcal Polysaccharide-23 04/29/2014   Tdap 11/20/2009, 04/27/2020   Zoster Recombinant(Shingrix ) 11/24/2021, 02/01/2022   Zoster, Live 05/24/2014    TDAP status: Up to date  Flu Vaccine  status: Due, Education has been provided regarding the importance of this vaccine. Advised may receive this vaccine at local pharmacy or Health Dept. Aware to provide a copy of the vaccination record if obtained from local pharmacy or Health Dept. Verbalized acceptance and understanding.  Pneumococcal vaccine status: Up to date  Covid-19 vaccine status: Information provided on how to obtain vaccines.   Qualifies for Shingles Vaccine? Yes   Zostavax completed Yes   Shingrix  Completed?: Yes  Screening Tests Health Maintenance  Topic Date Due   Medicare Annual Wellness (AWV)  06/17/2024   INFLUENZA VACCINE  06/11/2024   COVID-19 Vaccine (7 - Moderna risk 2024-25 season) 08/26/2024   DTaP/Tdap/Td (3 - Td or Tdap) 04/27/2030   Pneumococcal Vaccine: 50+ Years  Completed   DEXA SCAN  Completed   Hepatitis C Screening  Completed   Zoster Vaccines- Shingrix   Completed   HPV VACCINES  Aged Out   Meningococcal B Vaccine  Aged Out   Colonoscopy  Discontinued    Health Maintenance  Health Maintenance Due  Topic Date Due   Medicare Annual Wellness (AWV)  06/17/2024   INFLUENZA VACCINE  06/11/2024    Colorectal cancer screening: Type of screening: Colonoscopy. Completed 2022. Repeat every 10 years  Lung Cancer Screening: (Low Dose CT Chest recommended if Age 6-80 years, 20 pack-year currently smoking OR have quit w/in 15years.) does not qualify.   Lung Cancer Screening Referral:   Additional Screening:  Hepatitis C Screening: does qualify; Completed 2020  Vision Screening: Recommended annual ophthalmology exams for early detection of glaucoma and other disorders of the eye. Is the patient up to date with their annual eye exam?  Yes  Who is the provider or what is the name of the office in which the patient attends annual eye exams? [place] If pt is not established with a provider, would they like to be referred to a provider to establish care? No .   Dental Screening: Recommended  annual dental exams for proper oral hygiene  Diabetic Foot Exam: Not Diabetic  Community Resource Referral / Chronic Care Management: CRR required this visit?  No   CCM required this visit?  No     Plan:     I have personally reviewed and noted the following in the patient's chart:   Medical and social history Use of alcohol, tobacco or illicit drugs  Current medications and supplements including opioid prescriptions. Patient is not currently taking opioid prescriptions. Functional ability and status Nutritional status Physical activity Advanced directives List of other physicians Hospitalizations, surgeries, and ER visits in previous 12 months Vitals Screenings to include cognitive, depression, and falls Referrals and appointments  In addition, I have reviewed and discussed with patient certain preventive protocols, quality metrics, and best practice recommendations. A written personalized care plan for preventive services as well as general preventive health recommendations were provided to patient.     Kristeen JINNY Lunger, CMA   07/02/2024   After Visit Summary: (MyChart) Due to this being a telephonic visit, the after visit summary with patients personalized plan  was offered to patient via MyChart   Nurse Notes:

## 2024-07-07 ENCOUNTER — Encounter: Payer: Self-pay | Admitting: Internal Medicine

## 2024-07-07 ENCOUNTER — Ambulatory Visit: Payer: Medicare PPO | Admitting: Internal Medicine

## 2024-07-07 VITALS — BP 122/80 | HR 94 | Temp 98.1°F | Ht <= 58 in | Wt 140.6 lb

## 2024-07-07 DIAGNOSIS — K921 Melena: Secondary | ICD-10-CM

## 2024-07-07 DIAGNOSIS — N182 Chronic kidney disease, stage 2 (mild): Secondary | ICD-10-CM | POA: Diagnosis not present

## 2024-07-07 DIAGNOSIS — R413 Other amnesia: Secondary | ICD-10-CM

## 2024-07-07 DIAGNOSIS — Z8379 Family history of other diseases of the digestive system: Secondary | ICD-10-CM | POA: Diagnosis not present

## 2024-07-07 DIAGNOSIS — I129 Hypertensive chronic kidney disease with stage 1 through stage 4 chronic kidney disease, or unspecified chronic kidney disease: Secondary | ICD-10-CM

## 2024-07-07 DIAGNOSIS — L659 Nonscarring hair loss, unspecified: Secondary | ICD-10-CM | POA: Diagnosis not present

## 2024-07-07 DIAGNOSIS — F5101 Primary insomnia: Secondary | ICD-10-CM

## 2024-07-07 MED ORDER — TELMISARTAN 40 MG PO TABS: 40.0000 mg | ORAL_TABLET | Freq: Every day | ORAL | 1 refills | Status: AC

## 2024-07-07 MED ORDER — AMLODIPINE BESYLATE 2.5 MG PO TABS: 2.5000 mg | ORAL_TABLET | Freq: Every day | ORAL | 2 refills | Status: AC

## 2024-07-07 NOTE — Assessment & Plan Note (Signed)
 Chronic, controlled. She will continue with telmisartan 40mg  daily and amlodipine 2.5mg  daily. She is encouraged to follow low sodium diet. She will f/u in four to six months for re-evaluation.

## 2024-07-07 NOTE — Assessment & Plan Note (Signed)
She is encouraged to stay well hydrated and keep BP well controlled to decrease risk of CKD progression.

## 2024-07-07 NOTE — Patient Instructions (Signed)
 Hypertension, Adult Hypertension is another name for high blood pressure. High blood pressure forces your heart to work harder to pump blood. This can cause problems over time. There are two numbers in a blood pressure reading. There is a top number (systolic) over a bottom number (diastolic). It is best to have a blood pressure that is below 120/80. What are the causes? The cause of this condition is not known. Some other conditions can lead to high blood pressure. What increases the risk? Some lifestyle factors can make you more likely to develop high blood pressure: Smoking. Not getting enough exercise or physical activity. Being overweight. Having too much fat, sugar, calories, or salt (sodium) in your diet. Drinking too much alcohol . Other risk factors include: Having any of these conditions: Heart disease. Diabetes. High cholesterol. Kidney disease. Obstructive sleep apnea. Having a family history of high blood pressure and high cholesterol. Age. The risk increases with age. Stress. What are the signs or symptoms? High blood pressure may not cause symptoms. Very high blood pressure (hypertensive crisis) may cause: Headache. Fast or uneven heartbeats (palpitations). Shortness of breath. Nosebleed. Vomiting or feeling like you may vomit (nauseous). Changes in how you see. Very bad chest pain. Feeling dizzy. Seizures. How is this treated? This condition is treated by making healthy lifestyle changes, such as: Eating healthy foods. Exercising more. Drinking less alcohol . Your doctor may prescribe medicine if lifestyle changes do not help enough and if: Your top number is above 130. Your bottom number is above 80. Your personal target blood pressure may vary. Follow these instructions at home: Eating and drinking  If told, follow the DASH eating plan. To follow this plan: Fill one half of your plate at each meal with fruits and vegetables. Fill one fourth of your plate  at each meal with whole grains. Whole grains include whole-wheat pasta, brown rice, and whole-grain bread. Eat or drink low-fat dairy products, such as skim milk or low-fat yogurt. Fill one fourth of your plate at each meal with low-fat (lean) proteins. Low-fat proteins include fish, chicken without skin, eggs, beans, and tofu. Avoid fatty meat, cured and processed meat, or chicken with skin. Avoid pre-made or processed food. Limit the amount of salt in your diet to less than 1,500 mg each day. Do not drink alcohol  if: Your doctor tells you not to drink. You are pregnant, may be pregnant, or are planning to become pregnant. If you drink alcohol : Limit how much you have to: 0-1 drink a day for women. 0-2 drinks a day for men. Know how much alcohol  is in your drink. In the [place] one drink equals one 12 oz bottle of beer (355 mL), one 5 oz glass of wine (148 mL), or one 1 oz glass of hard liquor (44 mL). Lifestyle  Work with your doctor to stay at a healthy weight or to lose weight. Ask your doctor what the best weight is for you. Get at least 30 minutes of exercise that causes your heart to beat faster (aerobic exercise) most days of the week. This may include walking, swimming, or biking. Get at least 30 minutes of exercise that strengthens your muscles (resistance exercise) at least 3 days a week. This may include lifting weights or doing Pilates. Do not smoke or use any products that contain nicotine  or tobacco. If you need help quitting, ask your doctor. Check your blood pressure at home as told by your doctor. Keep all follow-up visits. Medicines Take over-the-counter and prescription medicines  only as told by your doctor. Follow directions carefully. Do not skip doses of blood pressure medicine. The medicine does not work as well if you skip doses. Skipping doses also puts you at risk for problems. Ask your doctor about side effects or reactions to medicines that you should watch  for. Contact a doctor if: You think you are having a reaction to the medicine you are taking. You have headaches that keep coming back. You feel dizzy. You have swelling in your ankles. You have trouble with your vision. Get help right away if: You get a very bad headache. You start to feel mixed up (confused). You feel weak or numb. You feel faint. You have very bad pain in your: Chest. Belly (abdomen). You vomit more than once. You have trouble breathing. These symptoms may be an emergency. Get help right away. Call 911. Do not wait to see if the symptoms will go away. Do not drive yourself to the hospital. Summary Hypertension is another name for high blood pressure. High blood pressure forces your heart to work harder to pump blood. For most people, a normal blood pressure is less than 120/80. Making healthy choices can help lower blood pressure. If your blood pressure does not get lower with healthy choices, you may need to take medicine. This information is not intended to replace advice given to you by your health care provider. Make sure you discuss any questions you have with your health care provider. Document Revised: 08/16/2021 Document Reviewed: 08/16/2021 Elsevier Patient Education  2024 [employer].

## 2024-07-07 NOTE — Progress Notes (Signed)
 I,Patricia Soto, CMA,acting as a Neurosurgeon for Patricia LOISE Slocumb, MD.,have documented all relevant documentation on the behalf of Patricia LOISE Slocumb, MD,as directed by  Patricia LOISE Slocumb, MD while in the presence of Patricia LOISE Slocumb, MD.  Subjective:  Patient ID: Patricia Soto , female    DOB: 12-24-1945 , 78 y.o.   MRN: [id number]  Chief Complaint  Patient presents with   Hypertension    Patient presents today for bpc. She reports compliance with medication. Denies headache, chest pain & sob.  She wants to discuss cognitive decline. Her sister she talks to often has noticed she is not as  sharp. She passed memory test taken last week during medicare well visit.  Her sister also recently tested pos for Celiac disease. Is their a blood test she can do.     HPI Discussed the use of AI scribe software for clinical note transcription with the patient, who gave verbal consent to proceed.  History of Present Illness Patricia Soto is a 78 year old female with hypertension who presents for a blood pressure check and follow-up on alopecia treatment.  Her blood pressure has shown improvement compared to earlier this year, although she has not been monitoring it regularly. She is currently on amlodipine  2.5 mg and telmisartan  40 mg, last refilled in February.  She was diagnosed with alopecia and began treatment on May 23rd, which includes collagen peptide, Viviscal twice daily, and a prescription with 7% minoxidil. She notes significant improvement in hair thickness and reduced balding spots after two months of treatment. However, she experiences diarrhea since starting Viviscal, described as runny and black. She manages her bowel movements with two packets of Benefiber daily.  Her sister, diagnosed with chronic leukemia in 2019, mentioned a possible genetic link to celiac disease. Her sister also has low hemoglobin and chronic kidney disease.  Her sister expressed concerns about her cognitive function,  noting memory issues. She acknowledges insufficient sleep, averaging about five hours per night, which she believes affects her cognition. She previously tried melatonin but experienced vivid dreams and found it unhelpful. She currently takes magnesium 500 mg at night.   Hypertension This is a chronic problem. The current episode started more than 1 year ago. The problem has been gradually improving since onset. The problem is controlled. Pertinent negatives include no blurred vision, chest pain, palpitations or shortness of breath. Past treatments include angiotensin blockers and diuretics. The current treatment provides moderate improvement. There are no compliance problems.      Past Medical History:  Diagnosis Date   Allergy 05/10/2023   Wasp sting   Anemia    hx teen   Angioedema 05/21/2019   Arthritis    Heart murmur    hx   Hypertension    Routine general medical examination at health care facility 01/08/2024     Family History  Problem Relation Age of Onset   Diabetes Father    Congenital heart disease Father    Hypertension Father    Heart attack Father    Heart disease Father    Hypertension Sister    Arthritis Sister    Asthma Sister    Hypertension Sister    Cancer Sister    Kidney disease Sister    Hypertension Mother    Other Mother        amyloidosis     Current Outpatient Medications:    Calcium Carbonate (CALCIUM 500 PO), Take 1 tablet by mouth daily. Takes 5 days  a week, Disp: , Rfl:    Cholecalciferol (VITAMIN D3) 250 MCG (10000 UT) capsule, daily. Takes 5 days a week, Disp: , Rfl:    Ginger, Zingiber officinalis, (GINGER ROOT) 550 MG CAPS, Take 1 capsule by mouth daily., Disp: , Rfl:    Magnesium 500 MG CAPS, daily., Disp: , Rfl:    Omega-3 1000 MG CAPS, daily, Disp: , Rfl:    Safety Seal Miscellaneous MISC, Apply 1 application  topically in the morning. Medication name: Hormonic Hair, Disp: 30 mL, Rfl: 3   Turmeric Curcumin 500 MG CAPS, daily, Disp:  , Rfl:    Wheat Dextrin (BENEFIBER ON THE GO) PACK, Take 2 packets by mouth 2 (two) times daily. , Disp: , Rfl:    amLODipine  (NORVASC ) 2.5 MG tablet, Take 1 tablet (2.5 mg total) by mouth daily., Disp: 90 tablet, Rfl: 2   telmisartan  (MICARDIS ) 40 MG tablet, Take 1 tablet (40 mg total) by mouth daily., Disp: 90 tablet, Rfl: 1   No Known Allergies   Review of Systems  Constitutional: Negative.   Eyes:  Negative for blurred vision.  Respiratory: Negative.  Negative for shortness of breath.   Cardiovascular: Negative.  Negative for chest pain and palpitations.  Neurological: Negative.   Psychiatric/Behavioral:  Positive for sleep disturbance.      Today's Vitals   07/07/24 1410  BP: 122/80  Pulse: 94  Temp: 98.1 F (36.7 C)  SpO2: 98%  Weight: 140 lb 9.6 oz (63.8 kg)  Height: 4' 10 (1.473 m)   Body mass index is 29.39 kg/m.  Wt Readings from Last 3 Encounters:  07/07/24 140 lb 9.6 oz (63.8 kg)  01/08/24 141 lb 3.2 oz (64 kg)  06/25/23 140 lb 12.8 oz (63.9 kg)     Objective:  Physical Exam Vitals and nursing note reviewed.  Constitutional:      Appearance: Normal appearance.  HENT:     Head: Normocephalic and atraumatic.  Eyes:     Extraocular Movements: Extraocular movements intact.  Cardiovascular:     Rate and Rhythm: Normal rate and regular rhythm.     Heart sounds: Normal heart sounds.  Pulmonary:     Effort: Pulmonary effort is normal.     Breath sounds: Normal breath sounds.  Musculoskeletal:     Cervical back: Normal range of motion.  Skin:    General: Skin is warm.  Neurological:     General: No focal deficit present.     Mental Status: She is alert.  Psychiatric:        Mood and Affect: Mood normal.        Behavior: Behavior normal.         Assessment And Plan:  Hypertensive nephropathy Assessment & Plan: Chronic, controlled. She will continue with telmisartan  40mg  daily and amlodipine  2.5mg  daily. She is encouraged to follow low sodium diet.  She will f/u in four to six months for re-evaluation.   Orders: -     CMP14+EGFR -     amLODIPine  Besylate; Take 1 tablet (2.5 mg total) by mouth daily.  Dispense: 90 tablet; Refill: 2  Chronic renal disease, stage II Assessment & Plan: She is encouraged to stay well hydrated and keep BP well controlled to decrease risk of CKD progression.    Hair thinning Assessment & Plan: She has been evaluated by Dermatology.  Alopecia treated with collagen peptide, Viviscal twice daily, and 7% minoxidil. Significant improvement in hair thickness and reduction in balding spots after three months.  Orders: -  CBC -     Iron, TIBC and Ferritin Panel  Primary insomnia Assessment & Plan: Insufficient sleep, averaging five hours per night. Melatonin trial resulted in vivid dreams without improved sleep duration. - Consider over-the-counter magnesium glycinate for sleep improvement. - Send a picture of current magnesium supplement bottle to provider for review.   Memory changes Assessment & Plan: Concerns raised by sister regarding potential cognitive decline. Recent Medicare wellness exam passed. B12 levels were low a year ago. Sleep deprivation noted, impacting cognition. - Recheck B12 levels.  Orders: -     Vitamin B12 -     TSH  Black stool Assessment & Plan: Diarrhea and black stools noted after starting Viviscal.  Differential includes medication-induced etiology versus gastrointestinal bleeding. - Check blood count to evaluate for potential gastrointestinal bleeding. - Adjust Viviscal intake to two tablets Monday through Friday and one tablet on Saturday and Sunday. - Consider adjusting fiber intake if diarrhea persists.   Orders: -     CBC  Family history of celiac disease -     Tissue Transglutaminase Abs,IgG,IgA  Other orders -     Telmisartan ; Take 1 tablet (40 mg total) by mouth daily.  Dispense: 90 tablet; Refill: 1   Return if symptoms worsen or fail to improve.   Patient was given opportunity to ask questions. Patient verbalized understanding of the plan and was able to repeat key elements of the plan. All questions were answered to their satisfaction.    I, Patricia LOISE Slocumb, MD, have reviewed all documentation for this visit. The documentation on 07/07/24 for the exam, diagnosis, procedures, and orders are all accurate and complete.   IF YOU HAVE BEEN REFERRED TO A SPECIALIST, IT MAY TAKE 1-2 WEEKS TO SCHEDULE/PROCESS THE REFERRAL. IF YOU HAVE NOT HEARD FROM US /SPECIALIST IN TWO WEEKS, PLEASE GIVE US  A CALL AT [phone] X [phone].   THE PATIENT IS ENCOURAGED TO PRACTICE SOCIAL DISTANCING DUE TO THE COVID-19 PANDEMIC.

## 2024-07-08 ENCOUNTER — Ambulatory Visit
Admission: RE | Admit: 2024-07-08 | Discharge: 2024-07-08 | Disposition: A | Discharge status: Home / Self Care | Source: Ambulatory Visit | Attending: Internal Medicine | Admitting: Internal Medicine

## 2024-07-08 ENCOUNTER — Ambulatory Visit: Payer: Self-pay | Admitting: Internal Medicine

## 2024-07-08 DIAGNOSIS — Z1231 Encounter for screening mammogram for malignant neoplasm of breast: Secondary | ICD-10-CM | POA: Diagnosis not present

## 2024-07-08 LAB — IRON,TIBC AND FERRITIN PANEL
Ferritin: 136 ng/mL (ref 15–150)
Iron Saturation: 24 % (ref 15–55)
Iron: 66 ug/dL (ref 27–139)
Total Iron Binding Capacity: 279 ug/dL (ref 250–450)
UIBC: 213 ug/dL (ref 118–369)

## 2024-07-09 DIAGNOSIS — H25813 Combined forms of age-related cataract, bilateral: Secondary | ICD-10-CM | POA: Diagnosis not present

## 2024-07-09 DIAGNOSIS — H40013 Open angle with borderline findings, low risk, bilateral: Secondary | ICD-10-CM | POA: Diagnosis not present

## 2024-07-09 DIAGNOSIS — H18513 Endothelial corneal dystrophy, bilateral: Secondary | ICD-10-CM | POA: Diagnosis not present

## 2024-07-09 DIAGNOSIS — H35363 Drusen (degenerative) of macula, bilateral: Secondary | ICD-10-CM | POA: Diagnosis not present

## 2024-07-09 DIAGNOSIS — H524 Presbyopia: Secondary | ICD-10-CM | POA: Diagnosis not present

## 2024-07-10 ENCOUNTER — Encounter: Payer: Self-pay | Admitting: Internal Medicine

## 2024-07-10 DIAGNOSIS — R413 Other amnesia: Secondary | ICD-10-CM | POA: Insufficient documentation

## 2024-07-10 LAB — CBC
Hematocrit: 33.5 % — ABNORMAL LOW (ref 34.0–46.6)
Hemoglobin: 10.8 g/dL — ABNORMAL LOW (ref 11.1–15.9)
MCH: 32.1 pg (ref 26.6–33.0)
MCHC: 32.2 g/dL (ref 31.5–35.7)
MCV: 100 fL — ABNORMAL HIGH (ref 79–97)
Platelets: 197 x10E3/uL (ref 150–450)
RBC: 3.36 x10E6/uL — ABNORMAL LOW (ref 3.77–5.28)
RDW: 12 % (ref 11.7–15.4)
WBC: 4.3 x10E3/uL (ref 3.4–10.8)

## 2024-07-10 LAB — CMP14+EGFR
ALT: 15 IU/L (ref 0–32)
AST: 23 IU/L (ref 0–40)
Albumin: 4.2 g/dL (ref 3.8–4.8)
Alkaline Phosphatase: 58 IU/L (ref 44–121)
BUN/Creatinine Ratio: 23 (ref 12–28)
BUN: 18 mg/dL (ref 8–27)
Bilirubin Total: 0.5 mg/dL (ref 0.0–1.2)
CO2: 23 mmol/L (ref 20–29)
Calcium: 9.5 mg/dL (ref 8.7–10.3)
Chloride: 104 mmol/L (ref 96–106)
Creatinine, Ser: 0.77 mg/dL (ref 0.57–1.00)
Globulin, Total: 2.4 g/dL (ref 1.5–4.5)
Glucose: 89 mg/dL (ref 70–99)
Potassium: 3.6 mmol/L (ref 3.5–5.2)
Sodium: 141 mmol/L (ref 134–144)
Total Protein: 6.6 g/dL (ref 6.0–8.5)
eGFR: 79 mL/min/1.73 (ref >59)

## 2024-07-10 LAB — TISSUE TRANSGLUTAMINASE ABS,IGG,IGA
Tissue Transglut Ab: 6 U/mL — ABNORMAL HIGH (ref 0–5)
Transglutaminase IgA: <2 U/mL (ref 0–3)

## 2024-07-10 LAB — VITAMIN B12: Vitamin B-12: 286 pg/mL (ref 232–1245)

## 2024-07-10 LAB — TSH: TSH: 2.03 u[IU]/mL (ref 0.450–4.500)

## 2024-07-10 NOTE — Assessment & Plan Note (Signed)
 She has been evaluated by Dermatology.  Alopecia treated with collagen peptide, Viviscal twice daily, and 7% minoxidil. Significant improvement in hair thickness and reduction in balding spots after three months.

## 2024-07-10 NOTE — Assessment & Plan Note (Signed)
 Insufficient sleep, averaging five hours per night. Melatonin trial resulted in vivid dreams without improved sleep duration. - Consider over-the-counter magnesium glycinate for sleep improvement. - Send a picture of current magnesium supplement bottle to provider for review.

## 2024-07-10 NOTE — Assessment & Plan Note (Signed)
 Diarrhea and black stools noted after starting Viviscal.  Differential includes medication-induced etiology versus gastrointestinal bleeding. - Check blood count to evaluate for potential gastrointestinal bleeding. - Adjust Viviscal intake to two tablets Monday through Friday and one tablet on Saturday and Sunday. - Consider adjusting fiber intake if diarrhea persists.

## 2024-07-10 NOTE — Assessment & Plan Note (Signed)
 Concerns raised by sister regarding potential cognitive decline. Recent Medicare wellness exam passed. B12 levels were low a year ago. Sleep deprivation noted, impacting cognition. - Recheck B12 levels.

## 2024-07-16 ENCOUNTER — Ambulatory Visit (INDEPENDENT_AMBULATORY_CARE_PROVIDER_SITE_OTHER): Payer: Self-pay

## 2024-07-16 VITALS — BP 124/80 | HR 95 | Temp 98.6°F | Ht <= 58 in | Wt 140.0 lb

## 2024-07-16 DIAGNOSIS — E538 Deficiency of other specified B group vitamins: Secondary | ICD-10-CM | POA: Diagnosis not present

## 2024-07-16 MED ORDER — CYANOCOBALAMIN 1000 MCG/ML IJ SOLN
1000.0000 ug | Freq: Once | INTRAMUSCULAR | Status: AC
  Administered 2024-07-16: 1000 ug via INTRAMUSCULAR

## 2024-07-16 NOTE — Progress Notes (Signed)
 Patient is in office today for a nurse visit for B12 Injection. Patient Injection was given in the  Left deltoid. Patient tolerated injection well.

## 2024-07-20 ENCOUNTER — Ambulatory Visit (INDEPENDENT_AMBULATORY_CARE_PROVIDER_SITE_OTHER): Payer: Self-pay

## 2024-07-20 VITALS — BP 122/70 | HR 73 | Temp 98.2°F | Ht <= 58 in | Wt 140.0 lb

## 2024-07-20 DIAGNOSIS — E538 Deficiency of other specified B group vitamins: Secondary | ICD-10-CM | POA: Diagnosis not present

## 2024-07-20 MED ORDER — CYANOCOBALAMIN 1000 MCG/ML IJ SOLN
1000.0000 ug | Freq: Once | INTRAMUSCULAR | Status: AC
  Administered 2024-07-20: 1000 ug via INTRAMUSCULAR

## 2024-07-20 NOTE — Patient Instructions (Signed)
 Vitamin B12 Deficiency Vitamin B12 deficiency means that your body does not have enough vitamin B12. The body needs this important vitamin: To make red blood cells. To make genes (DNA). To help the nerves work. If you do not have enough vitamin B12 in your body, you can have health problems, such as not having enough red blood cells in the blood (anemia). What are the causes? Not eating enough foods that contain vitamin B12. Not being able to take in (absorb) vitamin B12 from the food that you eat. Certain diseases. A condition in which the body does not make enough of a certain protein. This results in your body not taking in enough vitamin B12. Having a surgery in which part of the stomach or small intestine is taken out. Taking medicines that make it hard for the body to take in vitamin B12. These include: Heartburn medicines. Some medicines that are used to treat diabetes. What increases the risk? Being an older adult. Eating a vegetarian or vegan diet that does not include any foods that come from animals. Not eating enough foods that contain vitamin B12 while you are pregnant. Taking certain medicines. Having alcoholism. What are the signs or symptoms? In some cases, there are no symptoms. If the condition leads to too few blood cells or nerve damage, symptoms can occur, such as: Feeling weak or tired. Not being hungry. Losing feeling (numbness) or tingling in your hands and feet. Redness and burning of the tongue. Feeling sad (depressed). Confusion or memory problems. Trouble walking. If anemia is very bad, symptoms can include: Being short of breath. Being dizzy. Having a very fast heartbeat. How is this treated? Changing the way you eat and drink, such as: Eating more foods that contain vitamin B12. Drinking little or no alcohol. Getting vitamin B12 shots. Taking vitamin B12 supplements by mouth (orally). Your doctor will tell you the dose that is best for you. Follow  these instructions at home: Eating and drinking  Eat foods that come from animals and have a lot of vitamin B12 in them. These include: Meats and poultry. This includes beef, pork, chicken, [place], and organ meats, such as liver. Seafood, such as clams, rainbow trout, salmon, tuna, and haddock. Eggs. Dairy foods such as milk, yogurt, and cheese. Eat breakfast cereals that have vitamin B12 added to them (are fortified). Check the label. The items listed above may not be a complete list of foods and beverages you can eat and drink. Contact a dietitian for more information. Alcohol use Do not drink alcohol if: Your doctor tells you not to drink. You are pregnant, may be pregnant, or are planning to become pregnant. If you drink alcohol: Limit how much you have to: 0-1 drink a day for women. 0-2 drinks a day for men. Know how much alcohol is in your drink. In the [place] one drink equals one 12 oz bottle of beer (355 mL), one 5 oz glass of wine (148 mL), or one 1 oz glass of hard liquor (44 mL). General instructions Get any vitamin B12 shots if told by your doctor. Take supplements only as told by your doctor. Follow the directions. Keep all follow-up visits. Contact a doctor if: Your symptoms come back. Your symptoms get worse or do not get better with treatment. Get help right away if: You have trouble breathing. You have a very fast heartbeat. You have chest pain. You get dizzy. You faint. These symptoms may be an emergency. Get help right away. Call 911.  Do not wait to see if the symptoms will go away. Do not drive yourself to the hospital. Summary Vitamin B12 deficiency means that your body is not getting enough of the vitamin. In some cases, there are no symptoms of this condition. Treatment may include making a change in the way you eat and drink, getting shots, or taking supplements. Eat foods that have vitamin B12 in them. This information is not intended to replace advice  given to you by your health care provider. Make sure you discuss any questions you have with your health care provider. Document Revised: 06/22/2021 Document Reviewed: 06/22/2021 Elsevier Patient Education  2024 [employer].

## 2024-07-20 NOTE — Progress Notes (Signed)
 Patient presents today for her 2nd vitamin b12 injection. Patient received b12 injection in her left arm. Patient tolerated injection well.

## 2024-07-22 ENCOUNTER — Other Ambulatory Visit: Payer: Self-pay

## 2024-07-22 NOTE — Progress Notes (Unsigned)
 I,Kaylor Maiers T Emmitt, CMA,acting as a Neurosurgeon for [place], CMA.,have documented all relevant documentation on the behalf of Richerd ONEIDA Emmitt, [place] directed by  Richerd ONEIDA Emmitt, CMA while in the presence of Richerd ONEIDA Emmitt, [place].  Subjective:  Patient ID: Patricia Soto , female    DOB: 1946/05/03 , 78 y.o.   MRN: [id number]  No chief complaint on file.   HPI  HPI   Past Medical History:  Diagnosis Date   Allergy 05/10/2023   Wasp sting   Anemia    hx teen   Angioedema 05/21/2019   Arthritis    Heart murmur    hx   Hypertension    Routine general medical examination at health care facility 01/08/2024     Family History  Problem Relation Age of Onset   Diabetes Father    Congenital heart disease Father    Hypertension Father    Heart attack Father    Heart disease Father    Hypertension Sister    Arthritis Sister    Asthma Sister    Hypertension Sister    Cancer Sister    Kidney disease Sister    Hypertension Mother    Other Mother        amyloidosis     Current Outpatient Medications:    amLODipine  (NORVASC ) 2.5 MG tablet, Take 1 tablet (2.5 mg total) by mouth daily., Disp: 90 tablet, Rfl: 2   Calcium Carbonate (CALCIUM 500 PO), Take 1 tablet by mouth daily. Takes 5 days a week, Disp: , Rfl:    Cholecalciferol (VITAMIN D3) 250 MCG (10000 UT) capsule, daily. Takes 5 days a week, Disp: , Rfl:    Ginger, Zingiber officinalis, (GINGER ROOT) 550 MG CAPS, Take 1 capsule by mouth daily., Disp: , Rfl:    Magnesium 500 MG CAPS, daily., Disp: , Rfl:    Omega-3 1000 MG CAPS, daily, Disp: , Rfl:    Safety Seal Miscellaneous MISC, Apply 1 application  topically in the morning. Medication name: Hormonic Hair, Disp: 30 mL, Rfl: 3   telmisartan  (MICARDIS ) 40 MG tablet, Take 1 tablet (40 mg total) by mouth daily., Disp: 90 tablet, Rfl: 1   Turmeric Curcumin 500 MG CAPS, daily, Disp: , Rfl:    Wheat Dextrin (BENEFIBER ON THE GO) PACK, Take 2 packets by mouth  2 (two) times daily. , Disp: , Rfl:    No Known Allergies   Review of Systems   There were no vitals filed for this visit. There is no height or weight on file to calculate BMI.  Wt Readings from Last 3 Encounters:  07/20/24 140 lb (63.5 kg)  07/16/24 140 lb (63.5 kg)  07/07/24 140 lb 9.6 oz (63.8 kg)     Objective:  Physical Exam      Assessment And Plan:  There are no diagnoses linked to this encounter.   No follow-ups on file.  Patient was given opportunity to ask questions. Patient verbalized understanding of the plan and was able to repeat key elements of the plan. All questions were answered to their satisfaction.  Richerd ONEIDA Emmitt, CMA  I, Richerd ONEIDA Emmitt, CMA, have reviewed all documentation for this visit. The documentation on 07/22/24 for the exam, diagnosis, procedures, and orders are all accurate and complete.   IF YOU HAVE BEEN REFERRED TO A SPECIALIST, IT MAY TAKE 1-2 WEEKS TO SCHEDULE/PROCESS THE REFERRAL. IF YOU HAVE NOT HEARD FROM US /SPECIALIST IN TWO WEEKS, PLEASE GIVE US  A CALL AT [phone]  X [phone].   THE PATIENT IS ENCOURAGED TO PRACTICE SOCIAL DISTANCING DUE TO THE COVID-19 PANDEMIC.

## 2024-07-23 ENCOUNTER — Ambulatory Visit: Payer: Self-pay

## 2024-07-30 ENCOUNTER — Ambulatory Visit (INDEPENDENT_AMBULATORY_CARE_PROVIDER_SITE_OTHER): Payer: Self-pay

## 2024-07-30 VITALS — BP 110/60 | HR 98 | Temp 98.5°F | Ht <= 58 in | Wt 140.0 lb

## 2024-07-30 DIAGNOSIS — E538 Deficiency of other specified B group vitamins: Secondary | ICD-10-CM

## 2024-07-30 MED ORDER — CYANOCOBALAMIN 1000 MCG/ML IJ SOLN
1000.0000 ug | Freq: Once | INTRAMUSCULAR | Status: AC
  Administered 2024-07-30: 1000 ug via INTRAMUSCULAR

## 2024-07-30 NOTE — Progress Notes (Signed)
 Patient is in office today for a nurse visit for B12 Injection. Patient Injection was given in the  Left deltoid. Patient tolerated injection well.

## 2024-07-31 ENCOUNTER — Encounter: Payer: Self-pay | Admitting: Internal Medicine

## 2024-08-02 ENCOUNTER — Other Ambulatory Visit (INDEPENDENT_AMBULATORY_CARE_PROVIDER_SITE_OTHER): Payer: Self-pay

## 2024-08-02 DIAGNOSIS — K921 Melena: Secondary | ICD-10-CM | POA: Diagnosis not present

## 2024-08-02 LAB — HEMOCCULT GUIAC POC 1CARD (OFFICE)
Card #2 Fecal Occult Blod, POC: POSITIVE
Card #3 Fecal Occult Blood, POC: POSITIVE
Fecal Occult Blood, POC: POSITIVE — AB

## 2024-08-04 ENCOUNTER — Encounter: Payer: Self-pay | Admitting: Dermatology

## 2024-08-04 ENCOUNTER — Ambulatory Visit: Admitting: Dermatology

## 2024-08-04 VITALS — BP 131/65

## 2024-08-04 DIAGNOSIS — L649 Androgenic alopecia, unspecified: Secondary | ICD-10-CM | POA: Diagnosis not present

## 2024-08-04 MED ORDER — SAFETY SEAL MISCELLANEOUS MISC: 1.0000 | Freq: Every morning | 7 refills | Status: AC

## 2024-08-04 NOTE — Progress Notes (Signed)
   Follow-Up Visit   Subjective  Patricia Soto is a 78 y.o. female who presents for the following: Androgenetic alopecia follow up - Hair has gotten thicker and bald spot have decreased. She is using finasteride/minoxidil compund. She is taking Viviscal twice daily during the week and once daily on the weekends and using the [employer] Collagen Peptides. She is very please with her results so far.   The following portions of the chart were reviewed this encounter and updated as appropriate: medications, allergies, medical history  Review of Systems:  No other skin or systemic complaints except as noted in HPI or Assessment and Plan.  Objective  Well appearing patient in no apparent distress; mood and affect are within normal limits.  A focused examination was performed of the following areas: Scalp   Relevant exam findings are noted in the Assessment and Plan.             Assessment & Plan   1. Androgenetic Alopecia (Improved) - Assessment: Patient demonstrates significant improvement in hair growth since initiating treatment. Photographic evidence shows marked progress in hair density and thickness. The topical minoxidil-finasteride compound is identified as the primary driver of results, contributing to approximately 90% of the observed improvement. Patient reports consistent use of the prescribed topical treatment every morning. Oral supplements, including Viviscal and collagen, are being used as adjunctive therapy. - Plan:    Continue topical minoxidil-finasteride compound applied every morning    Provide new prescription for topical compound with multiple refills    Continue collagen supplement with tea    Consider discontinuing Viviscal after current supply is exhausted, due to gastrointestinal side effects    Patient educated on the importance of long-term maintenance therapy to sustain results  Follow-up in 8 months with transition to yearly follow-ups after the  next appointment.     Return in about 8 months (around 04/03/2025) for Alopecia.  I, Roseline Hutchinson, CMA, am acting as scribe for [employer], DO .   Documentation: I have reviewed the above documentation for accuracy and completeness, and I agree with the above.  Delon Lenis, DO

## 2024-08-04 NOTE — Patient Instructions (Addendum)
 Date: Wed Aug 04 2024  Hello Naz,  Thank you for visiting today. Here is a summary of the key instructions:  - Medications:   - Continue using minoxidil drops topically every morning   - You may reduce Viviscal to twice daily Monday through Friday and once daily on weekends due to stomach issues, or stop taking it when current supply runs out if stomach problems continue   - Continue taking collagen supplement with tea  - Treatment Instructions:   - Apply the minoxidil-finasteride compound in the morning to prevent it from getting on your face   - Continue treatment even if you move locations, as this treats but does not cure your condition   - If needed, over-the-counter minoxidil can be used to maintain results  - Follow-up:   - Next appointment in 8 months   - After that, appointments will be yearly  - Other Instructions:   - Message through MyChart for any questions  Please reach out if you have any questions or concerns.  Warm regards,  Dr. [facility] Important Information  Due to recent changes in healthcare laws, you may see results of your pathology and/or laboratory studies on MyChart before the doctors have had a chance to review them. We understand that in some cases there may be results that are confusing or concerning to you. Please understand that not all results are received at the same time and often the doctors may need to interpret multiple results in order to provide you with the best plan of care or course of treatment. Therefore, we ask that you please give us  2 business days to thoroughly review all your results before contacting the office for clarification. Should we see a critical lab result, you will be contacted sooner.   If You Need Anything After Your Visit  If you have any questions or concerns for your doctor, please call our main line at [phone] If no one answers, please leave a voicemail as directed and we will return your call  as soon as possible. Messages left after 4 pm will be answered the following business day.   You may also send us  a message via MyChart. We typically respond to MyChart messages within 1-2 business days.  For prescription refills, please ask your pharmacy to contact our office. Our fax number is [phone].  If you have an urgent issue when the clinic is closed that cannot wait until the next business day, you can page your doctor at the number below.    Please note that while we do our best to be available for urgent issues outside of office hours, we are not available 24/7.   If you have an urgent issue and are unable to reach us , you may choose to seek medical care at your doctor's office, retail clinic, urgent care center, or emergency room.  If you have a medical emergency, please immediately call 911 or go to the emergency department. In the event of inclement weather, please call our main line at [phone] for an update on the status of any delays or closures.  Dermatology Medication Tips: Please keep the boxes that topical medications come in in order to help keep track of the instructions about where and how to use these. Pharmacies typically print the medication instructions only on the boxes and not directly on the medication tubes.   If your medication is too expensive, please contact our office at [phone] or send us  a message through MyChart.  We are unable to tell what your co-pay for medications will be in advance as this is different depending on your insurance coverage. However, we may be able to find a substitute medication at lower cost or fill out paperwork to get insurance to cover a needed medication.   If a prior authorization is required to get your medication covered by your insurance company, please allow us  1-2 business days to complete this process.  Drug prices often vary depending on where the prescription is filled and some pharmacies may offer  cheaper prices.  The website www.goodrx.com contains coupons for medications through different pharmacies. The prices here do not account for what the cost may be with help from insurance (it may be cheaper with your insurance), but the website can give you the price if you did not use any insurance.  - You can print the associated coupon and take it with your prescription to the pharmacy.  - You may also stop by our office during regular business hours and pick up a GoodRx coupon card.  - If you need your prescription sent electronically to a different pharmacy, notify our office through [facility] or by phone at [phone]

## 2024-08-06 ENCOUNTER — Ambulatory Visit (INDEPENDENT_AMBULATORY_CARE_PROVIDER_SITE_OTHER)

## 2024-08-06 VITALS — BP 120/60 | HR 85 | Temp 98.5°F | Ht <= 58 in | Wt 140.0 lb

## 2024-08-06 DIAGNOSIS — E538 Deficiency of other specified B group vitamins: Secondary | ICD-10-CM | POA: Diagnosis not present

## 2024-08-06 MED ORDER — CYANOCOBALAMIN 1000 MCG/ML IJ SOLN
1000.0000 ug | Freq: Once | INTRAMUSCULAR | Status: AC
  Administered 2024-08-06: 1000 ug via INTRAMUSCULAR

## 2024-08-06 NOTE — Progress Notes (Signed)
 Patient is in office today for a nurse visit for B12 Injection. Patient Injection was given in the  Left deltoid. Patient tolerated injection well.

## 2024-08-07 ENCOUNTER — Ambulatory Visit: Payer: Self-pay | Admitting: Internal Medicine

## 2024-08-07 LAB — VITAMIN B12: Vitamin B-12: 699 pg/mL (ref 232–1245)

## 2024-09-10 ENCOUNTER — Ambulatory Visit (INDEPENDENT_AMBULATORY_CARE_PROVIDER_SITE_OTHER): Payer: Self-pay

## 2024-09-10 VITALS — BP 114/68 | HR 98 | Temp 98.5°F | Ht <= 58 in | Wt 140.0 lb

## 2024-09-10 DIAGNOSIS — E538 Deficiency of other specified B group vitamins: Secondary | ICD-10-CM

## 2024-09-10 MED ORDER — CYANOCOBALAMIN 1000 MCG/ML IJ SOLN
1000.0000 ug | Freq: Once | INTRAMUSCULAR | Status: AC
  Administered 2024-09-10: 1000 ug via INTRAMUSCULAR

## 2024-09-10 NOTE — Progress Notes (Signed)
 Patient is in office today for a nurse visit for B12 Injection. Patient Injection was given in the  Left deltoid. Patient tolerated injection well.

## 2024-10-01 ENCOUNTER — Ambulatory Visit (INDEPENDENT_AMBULATORY_CARE_PROVIDER_SITE_OTHER): Payer: Self-pay

## 2024-10-01 VITALS — BP 122/62 | HR 77 | Temp 97.7°F | Ht <= 58 in | Wt 140.0 lb

## 2024-10-01 DIAGNOSIS — E538 Deficiency of other specified B group vitamins: Secondary | ICD-10-CM

## 2024-10-01 MED ORDER — CYANOCOBALAMIN 1000 MCG/ML IJ SOLN
1000.0000 ug | Freq: Once | INTRAMUSCULAR | Status: AC
  Administered 2024-10-01: 1000 ug via INTRAMUSCULAR

## 2024-10-01 NOTE — Progress Notes (Signed)
 Patient is in office today for a nurse visit for B12 Injection. Patient Injection was given in the  Left deltoid. Patient tolerated injection well.

## 2024-10-01 NOTE — Patient Instructions (Signed)
 Vitamin B12 Deficiency Vitamin B12 deficiency means that your body does not have enough vitamin B12. The body needs this important vitamin: To make red blood cells. To make genes (DNA). To help the nerves work. If you do not have enough vitamin B12 in your body, you can have health problems, such as not having enough red blood cells in the blood (anemia). What are the causes? Not eating enough foods that contain vitamin B12. Not being able to take in (absorb) vitamin B12 from the food that you eat. Certain diseases. A condition in which the body does not make enough of a certain protein. This results in your body not taking in enough vitamin B12. Having a surgery in which part of the stomach or small intestine is taken out. Taking medicines that make it hard for the body to take in vitamin B12. These include: Heartburn medicines. Some medicines that are used to treat diabetes. What increases the risk? Being an older adult. Eating a vegetarian or vegan diet that does not include any foods that come from animals. Not eating enough foods that contain vitamin B12 while you are pregnant. Taking certain medicines. Having alcoholism. What are the signs or symptoms? In some cases, there are no symptoms. If the condition leads to too few blood cells or nerve damage, symptoms can occur, such as: Feeling weak or tired. Not being hungry. Losing feeling (numbness) or tingling in your hands and feet. Redness and burning of the tongue. Feeling sad (depressed). Confusion or memory problems. Trouble walking. If anemia is very bad, symptoms can include: Being short of breath. Being dizzy. Having a very fast heartbeat. How is this treated? Changing the way you eat and drink, such as: Eating more foods that contain vitamin B12. Drinking little or no alcohol. Getting vitamin B12 shots. Taking vitamin B12 supplements by mouth (orally). Your doctor will tell you the dose that is best for you. Follow  these instructions at home: Eating and drinking  Eat foods that come from animals and have a lot of vitamin B12 in them. These include: Meats and poultry. This includes beef, pork, chicken, [place], and organ meats, such as liver. Seafood, such as clams, rainbow trout, salmon, tuna, and haddock. Eggs. Dairy foods such as milk, yogurt, and cheese. Eat breakfast cereals that have vitamin B12 added to them (are fortified). Check the label. The items listed above may not be a complete list of foods and beverages you can eat and drink. Contact a dietitian for more information. Alcohol use Do not drink alcohol if: Your doctor tells you not to drink. You are pregnant, may be pregnant, or are planning to become pregnant. If you drink alcohol: Limit how much you have to: 0-1 drink a day for women. 0-2 drinks a day for men. Know how much alcohol is in your drink. In the [place] one drink equals one 12 oz bottle of beer (355 mL), one 5 oz glass of wine (148 mL), or one 1 oz glass of hard liquor (44 mL). General instructions Get any vitamin B12 shots if told by your doctor. Take supplements only as told by your doctor. Follow the directions. Keep all follow-up visits. Contact a doctor if: Your symptoms come back. Your symptoms get worse or do not get better with treatment. Get help right away if: You have trouble breathing. You have a very fast heartbeat. You have chest pain. You get dizzy. You faint. These symptoms may be an emergency. Get help right away. Call 911.  Do not wait to see if the symptoms will go away. Do not drive yourself to the hospital. Summary Vitamin B12 deficiency means that your body is not getting enough of the vitamin. In some cases, there are no symptoms of this condition. Treatment may include making a change in the way you eat and drink, getting shots, or taking supplements. Eat foods that have vitamin B12 in them. This information is not intended to replace advice  given to you by your health care provider. Make sure you discuss any questions you have with your health care provider. Document Revised: 06/22/2021 Document Reviewed: 06/22/2021 Elsevier Patient Education  2024 [employer].

## 2024-10-26 ENCOUNTER — Ambulatory Visit: Payer: Self-pay

## 2024-10-26 ENCOUNTER — Ambulatory Visit

## 2024-10-26 VITALS — BP 124/80 | HR 78 | Temp 98.3°F | Ht <= 58 in | Wt 140.0 lb

## 2024-10-26 DIAGNOSIS — E538 Deficiency of other specified B group vitamins: Secondary | ICD-10-CM

## 2024-10-26 MED ORDER — CYANOCOBALAMIN 1000 MCG/ML IJ SOLN
1000.0000 ug | Freq: Once | INTRAMUSCULAR | Status: AC
  Administered 2024-10-26: 10:00:00 1000 ug via INTRAMUSCULAR

## 2024-10-26 NOTE — Progress Notes (Signed)
 Patient is in office today for a nurse visit for B12 Injection. Patient Injection was given in the  Left deltoid. Patient tolerated injection well.

## 2024-10-26 NOTE — Patient Instructions (Signed)
 Vitamin B12 Deficiency Vitamin B12 deficiency means that your body does not have enough vitamin B12. The body needs this important vitamin: To make red blood cells. To make genes (DNA). To help the nerves work. If you do not have enough vitamin B12 in your body, you can have health problems, such as not having enough red blood cells in the blood (anemia). What are the causes? Not eating enough foods that contain vitamin B12. Not being able to take in (absorb) vitamin B12 from the food that you eat. Certain diseases. A condition in which the body does not make enough of a certain protein. This results in your body not taking in enough vitamin B12. Having a surgery in which part of the stomach or small intestine is taken out. Taking medicines that make it hard for the body to take in vitamin B12. These include: Heartburn medicines. Some medicines that are used to treat diabetes. What increases the risk? Being an older adult. Eating a vegetarian or vegan diet that does not include any foods that come from animals. Not eating enough foods that contain vitamin B12 while you are pregnant. Taking certain medicines. Having alcoholism. What are the signs or symptoms? In some cases, there are no symptoms. If the condition leads to too few blood cells or nerve damage, symptoms can occur, such as: Feeling weak or tired. Not being hungry. Losing feeling (numbness) or tingling in your hands and feet. Redness and burning of the tongue. Feeling sad (depressed). Confusion or memory problems. Trouble walking. If anemia is very bad, symptoms can include: Being short of breath. Being dizzy. Having a very fast heartbeat. How is this treated? Changing the way you eat and drink, such as: Eating more foods that contain vitamin B12. Drinking little or no alcohol. Getting vitamin B12 shots. Taking vitamin B12 supplements by mouth (orally). Your doctor will tell you the dose that is best for you. Follow  these instructions at home: Eating and drinking  Eat foods that come from animals and have a lot of vitamin B12 in them. These include: Meats and poultry. This includes beef, pork, chicken, [place], and organ meats, such as liver. Seafood, such as clams, rainbow trout, salmon, tuna, and haddock. Eggs. Dairy foods such as milk, yogurt, and cheese. Eat breakfast cereals that have vitamin B12 added to them (are fortified). Check the label. The items listed above may not be a complete list of foods and beverages you can eat and drink. Contact a dietitian for more information. Alcohol use Do not drink alcohol if: Your doctor tells you not to drink. You are pregnant, may be pregnant, or are planning to become pregnant. If you drink alcohol: Limit how much you have to: 0-1 drink a day for women. 0-2 drinks a day for men. Know how much alcohol is in your drink. In the [place] one drink equals one 12 oz bottle of beer (355 mL), one 5 oz glass of wine (148 mL), or one 1 oz glass of hard liquor (44 mL). General instructions Get any vitamin B12 shots if told by your doctor. Take supplements only as told by your doctor. Follow the directions. Keep all follow-up visits. Contact a doctor if: Your symptoms come back. Your symptoms get worse or do not get better with treatment. Get help right away if: You have trouble breathing. You have a very fast heartbeat. You have chest pain. You get dizzy. You faint. These symptoms may be an emergency. Get help right away. Call 911.  Do not wait to see if the symptoms will go away. Do not drive yourself to the hospital. Summary Vitamin B12 deficiency means that your body is not getting enough of the vitamin. In some cases, there are no symptoms of this condition. Treatment may include making a change in the way you eat and drink, getting shots, or taking supplements. Eat foods that have vitamin B12 in them. This information is not intended to replace advice  given to you by your health care provider. Make sure you discuss any questions you have with your health care provider. Document Revised: 06/22/2021 Document Reviewed: 06/22/2021 Elsevier Patient Education  2024 [employer].

## 2024-10-29 ENCOUNTER — Ambulatory Visit: Payer: Self-pay

## 2024-11-16 ENCOUNTER — Ambulatory Visit: Payer: Self-pay

## 2024-11-16 VITALS — BP 120/70 | HR 98 | Temp 98.6°F | Ht <= 58 in | Wt 140.0 lb

## 2024-11-16 DIAGNOSIS — E538 Deficiency of other specified B group vitamins: Secondary | ICD-10-CM

## 2024-11-16 MED ORDER — CYANOCOBALAMIN 1000 MCG/ML IJ SOLN
1000.0000 ug | Freq: Once | INTRAMUSCULAR | Status: AC
  Administered 2024-11-16: 1000 ug via INTRAMUSCULAR

## 2024-11-16 NOTE — Progress Notes (Signed)
 Patient is in office today for a nurse visit for B12 Injection. Patient Injection was given in the  Left deltoid. Patient tolerated injection well.

## 2024-12-14 ENCOUNTER — Ambulatory Visit: Payer: Self-pay

## 2024-12-17 ENCOUNTER — Ambulatory Visit

## 2024-12-17 VITALS — BP 120/74 | HR 98 | Temp 98.5°F | Ht <= 58 in | Wt 140.0 lb

## 2024-12-17 DIAGNOSIS — E538 Deficiency of other specified B group vitamins: Secondary | ICD-10-CM

## 2024-12-17 MED ORDER — CYANOCOBALAMIN 1000 MCG/ML IJ SOLN
1000.0000 ug | Freq: Once | INTRAMUSCULAR | Status: AC
  Administered 2024-12-17: 1000 ug via INTRAMUSCULAR

## 2024-12-17 NOTE — Progress Notes (Signed)
 Patient is in office today for a nurse visit for B12 Injection. Patient Injection was given in the  Left deltoid. Patient tolerated injection well.

## 2025-01-11 ENCOUNTER — Encounter: Payer: Medicare PPO | Admitting: Internal Medicine

## 2025-03-17 ENCOUNTER — Encounter: Admitting: Obstetrics and Gynecology

## 2025-03-31 ENCOUNTER — Ambulatory Visit: Admitting: Dermatology

## 2025-07-20 ENCOUNTER — Ambulatory Visit: Payer: Self-pay
